# Patient Record
Sex: Female | Born: 1995 | Race: Black or African American | Hispanic: No | Marital: Single | Smoking: Never smoker
Health system: Southern US, Community
[De-identification: ages and names within clinical notes are randomized; demographics above are authoritative.]

## PROBLEM LIST (undated history)

## (undated) DIAGNOSIS — D649 Anemia, unspecified: Secondary | ICD-10-CM

## (undated) DIAGNOSIS — K2970 Gastritis, unspecified, without bleeding: Secondary | ICD-10-CM

## (undated) HISTORY — DX: Gastritis, unspecified, without bleeding: K29.70

## (undated) HISTORY — PX: DILATION AND CURETTAGE OF UTERUS: SHX78

---

## 2019-03-23 ENCOUNTER — Emergency Department (HOSPITAL_COMMUNITY)
Admission: EM | Admit: 2019-03-23 | Discharge: 2019-03-23 | Discharge status: Against Medical Advice | Payer: Self-pay | Attending: Emergency Medicine | Admitting: Emergency Medicine

## 2019-03-23 DIAGNOSIS — Z5321 Procedure and treatment not carried out due to patient leaving prior to being seen by health care provider: Secondary | ICD-10-CM | POA: Insufficient documentation

## 2019-03-23 NOTE — ED Triage Notes (Signed)
Pt has left upper dental pain for "quite sometime" worse in the past few days.

## 2019-04-02 ENCOUNTER — Emergency Department (HOSPITAL_COMMUNITY)
Admission: EM | Admit: 2019-04-02 | Discharge: 2019-04-02 | Disposition: A | Discharge status: Home / Self Care | Payer: Self-pay | Attending: Emergency Medicine | Admitting: Emergency Medicine

## 2019-04-02 ENCOUNTER — Encounter (HOSPITAL_COMMUNITY): Payer: Self-pay | Admitting: Emergency Medicine

## 2019-04-02 ENCOUNTER — Emergency Department (HOSPITAL_COMMUNITY): Payer: Self-pay

## 2019-04-02 ENCOUNTER — Other Ambulatory Visit: Payer: Self-pay

## 2019-04-02 DIAGNOSIS — R1013 Epigastric pain: Secondary | ICD-10-CM

## 2019-04-02 DIAGNOSIS — K2970 Gastritis, unspecified, without bleeding: Secondary | ICD-10-CM | POA: Insufficient documentation

## 2019-04-02 HISTORY — DX: Anemia, unspecified: D64.9

## 2019-04-02 LAB — HCG, QUANTITATIVE, PREGNANCY: hCG, Beta Chain, Quant, S: 891 m[IU]/mL — ABNORMAL HIGH (ref <5)

## 2019-04-02 LAB — CBC WITH DIFFERENTIAL/PLATELET
Abs Immature Granulocytes: 0.02 10*3/uL (ref 0.00–0.07)
Basophils Absolute: 0 10*3/uL (ref 0.0–0.1)
Basophils Relative: 1 %
Eosinophils Absolute: 0.2 10*3/uL (ref 0.0–0.5)
Eosinophils Relative: 3 %
HCT: 32.8 % — ABNORMAL LOW (ref 36.0–46.0)
Hemoglobin: 10.8 g/dL — ABNORMAL LOW (ref 12.0–15.0)
Immature Granulocytes: 0 %
Lymphocytes Relative: 26 %
Lymphs Abs: 2.1 10*3/uL (ref 0.7–4.0)
MCH: 29.4 pg (ref 26.0–34.0)
MCHC: 32.9 g/dL (ref 30.0–36.0)
MCV: 89.4 fL (ref 80.0–100.0)
Monocytes Absolute: 1 10*3/uL (ref 0.1–1.0)
Monocytes Relative: 12 %
Neutro Abs: 4.7 10*3/uL (ref 1.7–7.7)
Neutrophils Relative %: 58 %
Platelets: 274 10*3/uL (ref 150–400)
RBC: 3.67 MIL/uL — ABNORMAL LOW (ref 3.87–5.11)
RDW: 13.8 % (ref 11.5–15.5)
WBC: 8 10*3/uL (ref 4.0–10.5)
nRBC: 0 % (ref 0.0–0.2)

## 2019-04-02 LAB — POC URINE PREG, ED: Preg Test, Ur: POSITIVE — AB

## 2019-04-02 LAB — COMPREHENSIVE METABOLIC PANEL
ALT: 20 U/L (ref 0–44)
AST: 20 U/L (ref 15–41)
Albumin: 3.3 g/dL — ABNORMAL LOW (ref 3.5–5.0)
Alkaline Phosphatase: 75 U/L (ref 38–126)
Anion gap: 8 (ref 5–15)
BUN: 9 mg/dL (ref 6–20)
CO2: 22 mmol/L (ref 22–32)
Calcium: 8.7 mg/dL — ABNORMAL LOW (ref 8.9–10.3)
Chloride: 106 mmol/L (ref 98–111)
Creatinine, Ser: 0.73 mg/dL (ref 0.44–1.00)
GFR calc Af Amer: >60 mL/min (ref >60)
GFR calc non Af Amer: >60 mL/min (ref >60)
Glucose, Bld: 100 mg/dL — ABNORMAL HIGH (ref 70–99)
Potassium: 3.5 mmol/L (ref 3.5–5.1)
Sodium: 136 mmol/L (ref 135–145)
Total Bilirubin: 0.5 mg/dL (ref 0.3–1.2)
Total Protein: 7.4 g/dL (ref 6.5–8.1)

## 2019-04-02 LAB — URINALYSIS, ROUTINE W REFLEX MICROSCOPIC
Bilirubin Urine: NEGATIVE
Glucose, UA: NEGATIVE mg/dL
Hgb urine dipstick: NEGATIVE
Ketones, ur: NEGATIVE mg/dL
Leukocytes,Ua: NEGATIVE
Nitrite: NEGATIVE
Protein, ur: NEGATIVE mg/dL
Specific Gravity, Urine: 1.027 (ref 1.005–1.030)
pH: 5 (ref 5.0–8.0)

## 2019-04-02 LAB — LIPASE, BLOOD: Lipase: 20 U/L (ref 11–51)

## 2019-04-02 NOTE — Discharge Instructions (Signed)
Please start taking famotidine, 20 mg daily to help control any acid reflux or gastritis.  This is safe in pregnancy.  Your blood work and ultrasound were normal, and does show that you are pregnant, very early, likely within the first 4 weeks of pregnancy.  2 weeks - follow up with a gynecologist - see phone number above for local physicians

## 2019-04-02 NOTE — ED Provider Notes (Signed)
[facility]   CSN: [id number] Arrival date & time: 04/02/19  1812     History   Chief Complaint Chief Complaint  Patient presents with  . Abdominal Pain    HPI Julia Salinas is a 23 y.o. female.     HPI  This patient is a very pleasant 23 year old female, G8, P2 at approximately 5 weeks based on last menstrual cycle.  She presents to the hospital today with a complaint of epigastric pain.  She reports this epigastric pain radiates into the right upper quadrant and the right lateral abdomen.  It does not go to the back, it is not associated with urinary symptoms nor is it associated with any vaginal discharge or bleeding.  She reports that she took a pregnancy test today and it was positive.  Her abdominal pain has been intermittent until the last 2 days where it is become more constant.  There is no nausea or vomiting, no changes in stools, she does not think it gets worse with position or with eating.  She has never had cholecystitis pancreatitis and does not drink any alcohol or smoke cigarettes.  At this time the pain is moderate  Past Medical History:  Diagnosis Date  . Chronic anemia     There are no active problems to display for this patient.   Past Surgical History:  Procedure Laterality Date  . DILATION AND CURETTAGE OF UTERUS       OB History    Gravida  1   Para      Term      Preterm      AB      Living        SAB      TAB      Ectopic      Multiple      Live Births               Home Medications    Prior to Admission medications   Not on File    Family History No family history on file.  Social History Social History   Tobacco Use  . Smoking status: Never Smoker  . Smokeless tobacco: Never Used  Substance Use Topics  . Alcohol use: Never    Frequency: Never  . Drug use: Never     Allergies   Penicillins   Review of Systems Review of Systems  All other  systems reviewed and are negative.    Physical Exam Updated Vital Signs BP 134/78 (BP Location: Left Arm)   Pulse 92   Temp 98.7 F (37.1 C) (Oral)   Resp 16   Ht 1.702 m (5\' 7" )   Wt 99.8 kg   LMP 03/02/2019 (Exact Date)   SpO2 100%   BMI 34.46 kg/m   Physical Exam Vitals signs and nursing note reviewed.  Constitutional:      General: She is not in acute distress.    Appearance: She is well-developed.  HENT:     Head: Normocephalic and atraumatic.     Mouth/Throat:     Pharynx: No oropharyngeal exudate.  Eyes:     General: No scleral icterus.       Right eye: No discharge.        Left eye: No discharge.     Conjunctiva/sclera: Conjunctivae normal.     Pupils: Pupils are equal, round, and reactive to light.  Neck:     Musculoskeletal: Normal range of motion and  neck supple.     Thyroid: No thyromegaly.     Vascular: No JVD.  Cardiovascular:     Rate and Rhythm: Normal rate and regular rhythm.     Heart sounds: Normal heart sounds. No murmur. No friction rub. No gallop.   Pulmonary:     Effort: Pulmonary effort is normal. No respiratory distress.     Breath sounds: Normal breath sounds. No wheezing or rales.  Abdominal:     General: Bowel sounds are normal. There is no distension.     Palpations: Abdomen is soft. There is no mass.     Tenderness: There is abdominal tenderness in the right upper quadrant and epigastric area.  Musculoskeletal: Normal range of motion.        General: No tenderness.  Lymphadenopathy:     Cervical: No cervical adenopathy.  Skin:    General: Skin is warm and dry.     Findings: No erythema or rash.  Neurological:     Mental Status: She is alert.     Coordination: Coordination normal.  Psychiatric:        Behavior: Behavior normal.      ED Treatments / Results  Labs (all labs ordered are listed, but only abnormal results are displayed) Labs Reviewed  CBC WITH DIFFERENTIAL/PLATELET - Abnormal; Notable for the following  components:      Result Value   RBC 3.67 (*)    Hemoglobin 10.8 (*)    HCT 32.8 (*)    All other components within normal limits  COMPREHENSIVE METABOLIC PANEL - Abnormal; Notable for the following components:   Glucose, Bld 100 (*)    Calcium 8.7 (*)    Albumin 3.3 (*)    All other components within normal limits  HCG, QUANTITATIVE, PREGNANCY - Abnormal; Notable for the following components:   hCG, Beta Chain, Quant, S 891 (*)    All other components within normal limits  POC URINE PREG, ED - Abnormal; Notable for the following components:   Preg Test, Ur POSITIVE (*)    All other components within normal limits  URINE CULTURE  URINALYSIS, ROUTINE W REFLEX MICROSCOPIC  LIPASE, BLOOD    EKG None  Radiology US Abdomen Limited  Result Date: 04/02/2019 CLINICAL DATA:  Right upper quadrant pain for 3 days EXAM: ULTRASOUND ABDOMEN LIMITED RIGHT UPPER QUADRANT COMPARISON:  None. FINDINGS: Gallbladder: No gallstones or wall thickening visualized. No sonographic Murphy sign noted by sonographer. Common bile duct: Diameter: 3.8 mm Liver: Increased echogenicity in the liver with no focal mass. Portal vein is patent on color Doppler imaging with normal direction of blood flow towards the liver. Other: None. IMPRESSION: 1. No acute abnormality identified 2. Increased echogenicity diffusely throughout the liver is nonspecific but often seen with hepatic steatosis. Electronically Signed   By: Gerome Sam III M.D   On: 04/02/2019 20:43    Procedures Procedures (including critical care time)  Medications Ordered in ED Medications - No data to display   Initial Impression / Assessment and Plan / ED Course  I have reviewed the triage vital signs and the nursing notes.  Pertinent labs & imaging results that were available during my care of the patient were reviewed by me and considered in my medical decision making (see chart for details).  Clinical Course as of Apr 01 2056  Sat Apr 02, 2019  2051 Thankfully the lab work, urine samples and pregnancy test do confirm she is early pregnant, no signs of cholecystitis, the patient  is totally stable for discharge, recommendations made regarding potential gastritis   [BM]    Clinical Course User Index [BM] Noemi Chapel, MD       The patient has absolutely no lower abdominal tenderness, she does have mild epigastric and right upper quadrant tenderness but no Murphy sign.  Labs pending, she does not have a leukocytosis, metabolic panel is pending, lipase pending, check quant to verify pregnancy, ultrasound to evaluate for gallbladder.  Patient agreeable  Final Clinical Impressions(s) / ED Diagnoses   Final diagnoses:  Epigastric pain  Gastritis without bleeding, unspecified chronicity, unspecified gastritis type      Noemi Chapel, MD 04/02/19 2057

## 2019-04-02 NOTE — ED Notes (Signed)
Called lab to add on urine culture ?

## 2019-04-02 NOTE — ED Triage Notes (Signed)
Pt to ED with c/o mid upper abd pain and upper back pain onset 3 days ago.  Pt denies any nausea, vomiting or diarrhea.

## 2019-04-04 LAB — URINE CULTURE

## 2019-04-19 ENCOUNTER — Emergency Department (HOSPITAL_COMMUNITY)
Admission: EM | Admit: 2019-04-19 | Discharge: 2019-04-20 | Disposition: A | Discharge status: Home / Self Care | Payer: Self-pay | Attending: Emergency Medicine | Admitting: Emergency Medicine

## 2019-04-19 ENCOUNTER — Encounter (HOSPITAL_COMMUNITY): Payer: Self-pay

## 2019-04-19 ENCOUNTER — Other Ambulatory Visit: Payer: Self-pay

## 2019-04-19 DIAGNOSIS — R109 Unspecified abdominal pain: Secondary | ICD-10-CM | POA: Insufficient documentation

## 2019-04-19 DIAGNOSIS — R42 Dizziness and giddiness: Secondary | ICD-10-CM | POA: Insufficient documentation

## 2019-04-19 DIAGNOSIS — R519 Headache, unspecified: Secondary | ICD-10-CM | POA: Insufficient documentation

## 2019-04-19 DIAGNOSIS — O26891 Other specified pregnancy related conditions, first trimester: Secondary | ICD-10-CM | POA: Insufficient documentation

## 2019-04-19 DIAGNOSIS — Z5321 Procedure and treatment not carried out due to patient leaving prior to being seen by health care provider: Secondary | ICD-10-CM | POA: Insufficient documentation

## 2019-04-19 NOTE — ED Triage Notes (Signed)
Pt states that she's about [redacted] weeks pregnant, she says that she's been cramping for a few days, having headaches and feeling like she was going to pass out Pt states she thinks she had preeclampsia with other pregnancy

## 2019-04-19 NOTE — ED Notes (Signed)
CURRENTLY USING PHONE 

## 2019-04-22 ENCOUNTER — Emergency Department (HOSPITAL_COMMUNITY)
Admission: EM | Admit: 2019-04-22 | Discharge: 2019-04-22 | Disposition: A | Discharge status: Home / Self Care | Payer: Self-pay | Attending: Emergency Medicine | Admitting: Emergency Medicine

## 2019-04-22 ENCOUNTER — Encounter (HOSPITAL_COMMUNITY): Payer: Self-pay

## 2019-04-22 ENCOUNTER — Emergency Department (HOSPITAL_COMMUNITY): Payer: Self-pay

## 2019-04-22 ENCOUNTER — Other Ambulatory Visit: Payer: Self-pay

## 2019-04-22 DIAGNOSIS — O209 Hemorrhage in early pregnancy, unspecified: Secondary | ICD-10-CM | POA: Insufficient documentation

## 2019-04-22 DIAGNOSIS — O4690 Antepartum hemorrhage, unspecified, unspecified trimester: Secondary | ICD-10-CM

## 2019-04-22 DIAGNOSIS — B9689 Other specified bacterial agents as the cause of diseases classified elsewhere: Secondary | ICD-10-CM | POA: Insufficient documentation

## 2019-04-22 DIAGNOSIS — O23591 Infection of other part of genital tract in pregnancy, first trimester: Secondary | ICD-10-CM | POA: Insufficient documentation

## 2019-04-22 DIAGNOSIS — O99891 Other specified diseases and conditions complicating pregnancy: Secondary | ICD-10-CM | POA: Insufficient documentation

## 2019-04-22 DIAGNOSIS — Z3A01 Less than 8 weeks gestation of pregnancy: Secondary | ICD-10-CM | POA: Insufficient documentation

## 2019-04-22 LAB — COMPREHENSIVE METABOLIC PANEL
ALT: 13 U/L (ref 0–44)
AST: 14 U/L — ABNORMAL LOW (ref 15–41)
Albumin: 4 g/dL (ref 3.5–5.0)
Alkaline Phosphatase: 67 U/L (ref 38–126)
Anion gap: 9 (ref 5–15)
BUN: 10 mg/dL (ref 6–20)
CO2: 22 mmol/L (ref 22–32)
Calcium: 8.9 mg/dL (ref 8.9–10.3)
Chloride: 104 mmol/L (ref 98–111)
Creatinine, Ser: 0.58 mg/dL (ref 0.44–1.00)
GFR calc Af Amer: >60 mL/min (ref >60)
GFR calc non Af Amer: >60 mL/min (ref >60)
Glucose, Bld: 88 mg/dL (ref 70–99)
Potassium: 3.2 mmol/L — ABNORMAL LOW (ref 3.5–5.1)
Sodium: 135 mmol/L (ref 135–145)
Total Bilirubin: 0.7 mg/dL (ref 0.3–1.2)
Total Protein: 8.3 g/dL — ABNORMAL HIGH (ref 6.5–8.1)

## 2019-04-22 LAB — CBC WITH DIFFERENTIAL/PLATELET
Abs Immature Granulocytes: 0.02 10*3/uL (ref 0.00–0.07)
Basophils Absolute: 0 10*3/uL (ref 0.0–0.1)
Basophils Relative: 1 %
Eosinophils Absolute: 0.2 10*3/uL (ref 0.0–0.5)
Eosinophils Relative: 2 %
HCT: 33.9 % — ABNORMAL LOW (ref 36.0–46.0)
Hemoglobin: 10.8 g/dL — ABNORMAL LOW (ref 12.0–15.0)
Immature Granulocytes: 0 %
Lymphocytes Relative: 34 %
Lymphs Abs: 2.3 10*3/uL (ref 0.7–4.0)
MCH: 28.6 pg (ref 26.0–34.0)
MCHC: 31.9 g/dL (ref 30.0–36.0)
MCV: 89.7 fL (ref 80.0–100.0)
Monocytes Absolute: 1.1 10*3/uL — ABNORMAL HIGH (ref 0.1–1.0)
Monocytes Relative: 17 %
Neutro Abs: 3.1 10*3/uL (ref 1.7–7.7)
Neutrophils Relative %: 46 %
Platelets: 283 10*3/uL (ref 150–400)
RBC: 3.78 MIL/uL — ABNORMAL LOW (ref 3.87–5.11)
RDW: 13.5 % (ref 11.5–15.5)
WBC: 6.7 10*3/uL (ref 4.0–10.5)
nRBC: 0 % (ref 0.0–0.2)

## 2019-04-22 LAB — URINALYSIS, ROUTINE W REFLEX MICROSCOPIC
Bilirubin Urine: NEGATIVE
Glucose, UA: NEGATIVE mg/dL
Hgb urine dipstick: NEGATIVE
Ketones, ur: NEGATIVE mg/dL
Leukocytes,Ua: NEGATIVE
Nitrite: NEGATIVE
Protein, ur: 30 mg/dL — AB
Specific Gravity, Urine: 1.029 (ref 1.005–1.030)
pH: 5 (ref 5.0–8.0)

## 2019-04-22 LAB — WET PREP, GENITAL
Sperm: NONE SEEN
Trich, Wet Prep: NONE SEEN
Yeast Wet Prep HPF POC: NONE SEEN

## 2019-04-22 LAB — HCG, QUANTITATIVE, PREGNANCY: hCG, Beta Chain, Quant, S: 98695 m[IU]/mL — ABNORMAL HIGH (ref <5)

## 2019-04-22 LAB — ABO/RH: ABO/RH(D): O POS

## 2019-04-22 LAB — I-STAT BETA HCG BLOOD, ED (MC, WL, AP ONLY): I-stat hCG, quantitative: >2000 m[IU]/mL — ABNORMAL HIGH (ref <5)

## 2019-04-22 LAB — HIV ANTIBODY (ROUTINE TESTING W REFLEX): HIV Screen 4th Generation wRfx: NONREACTIVE

## 2019-04-22 MED ORDER — METRONIDAZOLE 0.75 % VA GEL: 1.0000 | Freq: Every day | VAGINAL | 0 refills | Status: AC

## 2019-04-22 MED ORDER — SODIUM CHLORIDE 0.9 % IV BOLUS
1000.0000 mL | Freq: Once | INTRAVENOUS | Status: AC
  Administered 2019-04-22: 16:00:00 1000 mL via INTRAVENOUS

## 2019-04-22 MED ORDER — POTASSIUM CHLORIDE CRYS ER 20 MEQ PO TBCR: 20.0000 meq | EXTENDED_RELEASE_TABLET | Freq: Every day | ORAL | 0 refills | Status: DC

## 2019-04-22 NOTE — ED Provider Notes (Signed)
[facility] DEPT Provider Note   CSN: [id number] Arrival date & time: 04/22/19  1530     History   Chief Complaint Chief Complaint  Patient presents with  . Vaginal Bleeding    [redacted] weeks pregnant    HPI Julia Salinas is a 23 y.o. female with a hx of chronic anemia & prior D&C who is currently about [redacted] weeks pregnant G7P2A4 & presents to the ED with complaints of pelvic pain and vaginal bleeding for the past few days.  Patient states she had constant pelvic cramping bilaterally that is currently a 7 out of 10 in severity without alleviating or aggravating factors associated with vaginal bleeding described more as pink spotting when wiping with toilet paper. This concerned her therefore she came to the ED a few days ago and left due to waits, returned today due to continued sxs. Has felt nauseated throughout early pregnancy. Denies fever, chills, emesis, diarrhea, melena, hematochezia, vaginal discharge, or dysuria. Patient has not had [place] with this pregnancy.      HPI  Past Medical History:  Diagnosis Date  . Chronic anemia     There are no active problems to display for this patient.   Past Surgical History:  Procedure Laterality Date  . DILATION AND CURETTAGE OF UTERUS       OB History    Gravida  1   Para      Term      Preterm      AB      Living        SAB      TAB      Ectopic      Multiple      Live Births               Home Medications    Prior to Admission medications   Not on File    Family History Family History  Problem Relation Age of Onset  . Heart failure Mother   . Lupus Father     Social History Social History   Tobacco Use  . Smoking status: Never Smoker  . Smokeless tobacco: Never Used  Substance Use Topics  . Alcohol use: Never    Frequency: Never  . Drug use: Never     Allergies   Penicillins   Review of Systems Review of Systems  Constitutional: Negative for chills and  fever.  Respiratory: Negative for shortness of breath.   Cardiovascular: Negative for chest pain.  Gastrointestinal: Positive for nausea. Negative for blood in stool, constipation, diarrhea and vomiting.  Genitourinary: Positive for pelvic pain and vaginal bleeding. Negative for dysuria, urgency and vaginal discharge.  Neurological: Negative for syncope.  All other systems reviewed and are negative.    Physical Exam Updated Vital Signs BP 131/60 (BP Location: Left Arm)   Pulse 85   Temp 98.6 F (37 C) (Oral)   Resp 16   Ht 5\' 7"  (1.702 m)   Wt 95.3 kg   LMP 03/02/2019 (Exact Date)   SpO2 99%   BMI 32.89 kg/m   Physical Exam Vitals signs and nursing note reviewed. Exam conducted with a chaperone present.  Constitutional:      General: She is not in acute distress.    Appearance: She is well-developed. She is not toxic-appearing.  HENT:     Head: Normocephalic and atraumatic.  Eyes:     General:        Right eye: No discharge.  Left eye: No discharge.     Conjunctiva/sclera: Conjunctivae normal.  Neck:     Musculoskeletal: Neck supple.  Cardiovascular:     Rate and Rhythm: Normal rate and regular rhythm.  Pulmonary:     Effort: Pulmonary effort is normal. No respiratory distress.     Breath sounds: Normal breath sounds. No wheezing, rhonchi or rales.  Abdominal:     General: There is no distension.     Palpations: Abdomen is soft.     Tenderness: There is abdominal tenderness (mild suprapubic). There is no right CVA tenderness, left CVA tenderness, guarding or rebound.  Genitourinary:    Labia:        Right: No lesion.        Left: No lesion.      Vagina: No bleeding.     Cervix: Discharge (mild white) present. No cervical motion tenderness, friability or cervical bleeding.     Adnexa:        Right: Tenderness present.        Left: No tenderness.    Skin:    General: Skin is warm and dry.     Findings: No rash.  Neurological:     Mental Status: She is  alert.     Comments: Clear speech.   Psychiatric:        Behavior: Behavior normal.    ED Treatments / Results  Labs (all labs ordered are listed, but only abnormal results are displayed) Labs Reviewed  WET PREP, GENITAL - Abnormal; Notable for the following components:      Result Value   Clue Cells Wet Prep HPF POC PRESENT (*)    WBC, Wet Prep HPF POC MODERATE (*)    All other components within normal limits  URINALYSIS, ROUTINE W REFLEX MICROSCOPIC - Abnormal; Notable for the following components:   Color, Urine AMBER (*)    APPearance HAZY (*)    Protein, ur 30 (*)    Bacteria, UA RARE (*)    All other components within normal limits  HCG, QUANTITATIVE, PREGNANCY - Abnormal; Notable for the following components:   hCG, Beta Chain, Quant, S [id number] (*)    All other components within normal limits  CBC WITH DIFFERENTIAL/PLATELET - Abnormal; Notable for the following components:   RBC 3.78 (*)    Hemoglobin 10.8 (*)    HCT 33.9 (*)    Monocytes Absolute 1.1 (*)    All other components within normal limits  COMPREHENSIVE METABOLIC PANEL - Abnormal; Notable for the following components:   Potassium 3.2 (*)    Total Protein 8.3 (*)    AST 14 (*)    All other components within normal limits  I-STAT BETA HCG BLOOD, ED ([facility], [facility], [facility] ONLY) - Abnormal; Notable for the following components:   I-stat hCG, quantitative >2,000.0 (*)    All other components within normal limits  URINE CULTURE  RPR  HIV ANTIBODY (ROUTINE TESTING W REFLEX)  HIV4GL SAVE TUBE  I-STAT BETA HCG BLOOD, ED ([facility], [facility], [facility] ONLY)  ABO/RH  GC/CHLAMYDIA PROBE AMP ([place]) NOT AT [facility]    EKG None  Radiology No results found.  Procedures Procedures (including critical care time)  Medications Ordered in ED Medications - No data to display   Initial Impression / Assessment and Plan / ED Course  I have reviewed the triage vital signs and the nursing notes.  Pertinent labs & imaging results that were  available during my care of the patient were reviewed by  me and considered in my medical decision making (see chart for details).   27P2A4 female estimated to be [redacted] weeks pregnant presents to the ED w/ complaints of pelvic cramping & vaginal spotting. Nontoxic appearing, no apparent distress, vitals WNL. Exam w/ some mild suprapubic tenderness, R adnexal tenderness on bimanual, some discharge w/ speculum exam no bleeding. Plan for labs & US.   CBC: Anemia consistent w prior. No leukocytosis.  CMP: Mild hypokalemia- will orally replace. Otherwise fairly unremarkable.  UA: No UTI, rare bacteria- given pregnancy will add on culture but do not feel this needs treatment. Protein present- PCP recheck.  Wet prep: BV- will tx w/ metrogel Preg test: positive Quant: 98,695  18:00: Patient care signed out to Glenford BayleyAlex Law PA-C at change of shift pending ultrasound and disposition.     Final Clinical Impressions(s) / ED Diagnoses   Final diagnoses:  Vaginal bleeding in pregnancy    ED Discharge Orders         Ordered    metroNIDAZOLE (METROGEL VAGINAL) 0.75 % vaginal gel  Daily at bedtime     04/22/19 1759    potassium chloride SA (KLOR-CON) 20 MEQ tablet  Daily     04/22/19 1759           Cherly Andersonetrucelli, Emmalea Treanor R, PA-C 04/22/19 1801    Lorre NickAllen, Anthony, MD 04/25/19 1220

## 2019-04-22 NOTE — ED Provider Notes (Signed)
Signout from previous provider Colletta Maryland, PA-C at shift change See previous provider note for full H&P  Briefly, patient presenting with vaginal spotting in the setting of 7 weeks pregnancy.  Patient has not seen OB/GYN yet.  Patient found to have BV and mild hypokalemia, previous provider plan to treat with MetroGel and potassium.  Ultrasound is pending.  Ultrasound returns with single intrauterine gestation at 7 weeks, 1 day with no subchorionic hemorrhage.  Plan to follow-up with OB as planned.  Patient understands and agrees with plan.  Patient vital stable discharge in satisfactory condition.    Frederica Kuster, PA-C 04/22/19 1937    Lacretia Leigh, MD 04/25/19 1220

## 2019-04-22 NOTE — Discharge Instructions (Addendum)
You were seen in the emergency department today for pelvic cramping and vaginal bleeding in pregnancy.  Your work-up was overall reassuring.  Your wet prep showed bacterial vaginosis.  She we are treating with metronidazole gel to use nightly intravaginally for the next 5 days.  Your potassium was a bit low therefore we are sending you home with a potassium supplement.  Your ultrasound showed a single intrauterine gestation at 7 weeks 1 day.  There were no complications seen.  We would like you to follow-up with OB/GYN within the next 3 to 5 days.  Return to the ER for new or worsening symptoms including but not limited to worsening pain, increased bleeding, passing out, dizziness, inability to keep fluids down, or any other concerns.  You may also return to our MAU which is a an emergency department for pregnant women.

## 2019-04-22 NOTE — ED Triage Notes (Signed)
Patient states she began having abdominal cramping and vaginal bleeding light pink in color 2 days ago. Patient state she came to the ED, but left due to wait times. Patient states today the abdominal cramping is worse,mbut the vaginal bleeding has not changed.

## 2019-04-23 LAB — RPR: RPR Ser Ql: NONREACTIVE

## 2019-04-24 LAB — URINE CULTURE: Culture: <10000 — AB

## 2019-04-26 LAB — GC/CHLAMYDIA PROBE AMP (~~LOC~~) NOT AT ARMC
Chlamydia: NEGATIVE
Neisseria Gonorrhea: NEGATIVE

## 2019-06-10 ENCOUNTER — Emergency Department (HOSPITAL_COMMUNITY)
Admission: EM | Admit: 2019-06-10 | Discharge: 2019-06-10 | Disposition: A | Discharge status: Home / Self Care | Payer: Self-pay | Attending: Emergency Medicine | Admitting: Emergency Medicine

## 2019-06-10 ENCOUNTER — Encounter (HOSPITAL_COMMUNITY): Payer: Self-pay | Admitting: Physician Assistant

## 2019-06-10 ENCOUNTER — Emergency Department (HOSPITAL_COMMUNITY): Payer: Self-pay

## 2019-06-10 ENCOUNTER — Other Ambulatory Visit: Payer: Self-pay

## 2019-06-10 DIAGNOSIS — R1031 Right lower quadrant pain: Secondary | ICD-10-CM | POA: Insufficient documentation

## 2019-06-10 DIAGNOSIS — B9689 Other specified bacterial agents as the cause of diseases classified elsewhere: Secondary | ICD-10-CM

## 2019-06-10 DIAGNOSIS — R1032 Left lower quadrant pain: Secondary | ICD-10-CM | POA: Insufficient documentation

## 2019-06-10 DIAGNOSIS — Z3A14 14 weeks gestation of pregnancy: Secondary | ICD-10-CM | POA: Insufficient documentation

## 2019-06-10 DIAGNOSIS — O23592 Infection of other part of genital tract in pregnancy, second trimester: Secondary | ICD-10-CM | POA: Insufficient documentation

## 2019-06-10 DIAGNOSIS — O26892 Other specified pregnancy related conditions, second trimester: Secondary | ICD-10-CM | POA: Insufficient documentation

## 2019-06-10 DIAGNOSIS — Z79899 Other long term (current) drug therapy: Secondary | ICD-10-CM | POA: Insufficient documentation

## 2019-06-10 LAB — CBC WITH DIFFERENTIAL/PLATELET
Abs Immature Granulocytes: 0.03 10*3/uL (ref 0.00–0.07)
Basophils Absolute: 0 10*3/uL (ref 0.0–0.1)
Basophils Relative: 0 %
Eosinophils Absolute: 0.3 10*3/uL (ref 0.0–0.5)
Eosinophils Relative: 3 %
HCT: 32.6 % — ABNORMAL LOW (ref 36.0–46.0)
Hemoglobin: 10.5 g/dL — ABNORMAL LOW (ref 12.0–15.0)
Immature Granulocytes: 0 %
Lymphocytes Relative: 19 %
Lymphs Abs: 1.6 10*3/uL (ref 0.7–4.0)
MCH: 29.8 pg (ref 26.0–34.0)
MCHC: 32.2 g/dL (ref 30.0–36.0)
MCV: 92.6 fL (ref 80.0–100.0)
Monocytes Absolute: 0.9 10*3/uL (ref 0.1–1.0)
Monocytes Relative: 11 %
Neutro Abs: 5.8 10*3/uL (ref 1.7–7.7)
Neutrophils Relative %: 67 %
Platelets: 262 10*3/uL (ref 150–400)
RBC: 3.52 MIL/uL — ABNORMAL LOW (ref 3.87–5.11)
RDW: 14.6 % (ref 11.5–15.5)
WBC: 8.7 10*3/uL (ref 4.0–10.5)
nRBC: 0 % (ref 0.0–0.2)

## 2019-06-10 LAB — HCG, QUANTITATIVE, PREGNANCY: hCG, Beta Chain, Quant, S: 38253 m[IU]/mL — ABNORMAL HIGH (ref <5)

## 2019-06-10 LAB — COMPREHENSIVE METABOLIC PANEL
ALT: <5 U/L (ref 0–44)
AST: 12 U/L — ABNORMAL LOW (ref 15–41)
Albumin: 3 g/dL — ABNORMAL LOW (ref 3.5–5.0)
Alkaline Phosphatase: 60 U/L (ref 38–126)
Anion gap: 8 (ref 5–15)
BUN: 8 mg/dL (ref 6–20)
CO2: 22 mmol/L (ref 22–32)
Calcium: 8.8 mg/dL — ABNORMAL LOW (ref 8.9–10.3)
Chloride: 106 mmol/L (ref 98–111)
Creatinine, Ser: 0.53 mg/dL (ref 0.44–1.00)
GFR calc Af Amer: >60 mL/min (ref >60)
GFR calc non Af Amer: >60 mL/min (ref >60)
Glucose, Bld: 98 mg/dL (ref 70–99)
Potassium: 3.5 mmol/L (ref 3.5–5.1)
Sodium: 136 mmol/L (ref 135–145)
Total Bilirubin: 0.3 mg/dL (ref 0.3–1.2)
Total Protein: 7.4 g/dL (ref 6.5–8.1)

## 2019-06-10 LAB — WET PREP, GENITAL
Sperm: NONE SEEN
Trich, Wet Prep: NONE SEEN
Yeast Wet Prep HPF POC: NONE SEEN

## 2019-06-10 LAB — URINALYSIS, ROUTINE W REFLEX MICROSCOPIC
Bilirubin Urine: NEGATIVE
Glucose, UA: NEGATIVE mg/dL
Hgb urine dipstick: NEGATIVE
Ketones, ur: NEGATIVE mg/dL
Leukocytes,Ua: NEGATIVE
Nitrite: NEGATIVE
Protein, ur: NEGATIVE mg/dL
Specific Gravity, Urine: 1.02 (ref 1.005–1.030)
pH: 7 (ref 5.0–8.0)

## 2019-06-10 MED ORDER — METRONIDAZOLE 500 MG PO TABS: 500.0000 mg | ORAL_TABLET | Freq: Two times a day (BID) | ORAL | 0 refills | Status: DC

## 2019-06-10 MED ORDER — METRONIDAZOLE 500 MG PO TABS
500.0000 mg | ORAL_TABLET | Freq: Once | ORAL | Status: AC
  Administered 2019-06-10: 500 mg via ORAL
  Filled 2019-06-10: qty 1

## 2019-06-10 MED ORDER — SODIUM CHLORIDE 0.9 % IV BOLUS
1000.0000 mL | Freq: Once | INTRAVENOUS | Status: AC
  Administered 2019-06-10: 1000 mL via INTRAVENOUS

## 2019-06-10 NOTE — ED Notes (Signed)
Provider at bedside

## 2019-06-10 NOTE — ED Provider Notes (Signed)
[facility]-EMERGENCY DEPT Provider Note   CSN: [id number] Arrival date & time: 06/10/19  1452     History   Chief Complaint Chief Complaint  Patient presents with  . Groin Swelling    vaginal pressure (14 wks preg)    HPI Julia Salinas is a 23 y.o. female G7P2 currently [redacted]w[redacted]d who presents today for evaluation of pelvic and groin pain.  She reports that over the past 4 days she has had worsening pain.  She states that it is worse when she stands and walks.  She reports that she feels like it is a significant pressure, primarily on the left side.  The states that she has seen a OB/GYN this pregnancy in [place] as that is where her insurance is however has not seen them locally.  She currently denies any vaginal bleeding, spotting, or loss of fluids.  She states that she feels otherwise well.  She denies dysuria.  She states that she has had 2 spontaneous abortions, 1 of which was at about 15 weeks and states this feels similar.    She was previously seen at about 7 weeks for vaginal bleeding at that time ultrasound was obtained showing a intrauterine gestation.       HPI  Past Medical History:  Diagnosis Date  . Chronic anemia     There are no active problems to display for this patient.   Past Surgical History:  Procedure Laterality Date  . DILATION AND CURETTAGE OF UTERUS       OB History    Gravida  7   Para  2   Term      Preterm      AB  4   Living        SAB  2   TAB  2   Ectopic      Multiple      Live Births  2            Home Medications    Prior to Admission medications   Medication Sig Start Date End Date Taking? Authorizing Provider  Prenatal Vit-Fe Fumarate-FA (MULTIVITAMIN-PRENATAL) 27-0.8 MG TABS tablet Take 1 tablet by mouth daily.   Yes [provider]  metroNIDAZOLE (FLAGYL) 500 MG tablet Take 1 tablet (500 mg total) by mouth 2 (two) times daily. 06/10/19   Cristina Gong, PA-C  potassium  chloride SA (KLOR-CON) 20 MEQ tablet Take 1 tablet (20 mEq total) by mouth daily. Patient not taking: Reported on 06/10/2019 04/22/19   Petrucelli, Pleas Koch, PA-C    Family History Family History  Problem Relation Age of Onset  . Heart failure Mother   . Lupus Father     Social History Social History   Tobacco Use  . Smoking status: Never Smoker  . Smokeless tobacco: Never Used  Substance Use Topics  . Alcohol use: Never    Frequency: Never  . Drug use: Never     Allergies   Penicillins   Review of Systems Review of Systems  Constitutional: Negative for chills and fever.  Respiratory: Negative for cough and shortness of breath.   Genitourinary: Negative for dysuria, frequency, urgency, vaginal bleeding, vaginal discharge and vaginal pain.       Pelvic and bilateral inguinal (L>R) pain  Musculoskeletal: Negative for back pain.  Neurological: Negative for weakness.  Psychiatric/Behavioral: Negative for confusion.  All other systems reviewed and are negative.    Physical Exam Updated Vital Signs BP 129/65 (BP Location: Right  Arm)   Pulse 95   Temp 98.8 F (37.1 C) (Oral)   Resp (!) 24   Ht 5\' 7"  (1.702 m)   Wt 103.4 kg   LMP 03/02/2019 (Exact Date)   SpO2 100%   BMI 35.71 kg/m   Physical Exam Vitals signs and nursing note reviewed. Exam conducted with a chaperone present (Female ED tech).  Constitutional:      General: She is not in acute distress.    Appearance: She is well-developed. She is not diaphoretic.     Comments: Generally well-appearing  HENT:     Head: Normocephalic and atraumatic.  Eyes:     General: No scleral icterus.       Right eye: No discharge.        Left eye: No discharge.     Conjunctiva/sclera: Conjunctivae normal.  Neck:     Musculoskeletal: Normal range of motion.  Cardiovascular:     Rate and Rhythm: Normal rate and regular rhythm.  Pulmonary:     Effort: Pulmonary effort is normal. No respiratory distress.     Breath  sounds: No stridor.  Abdominal:     General: Abdomen is flat. Bowel sounds are normal. There is no distension.     Palpations: Abdomen is soft.     Comments: There is mild tenderness to palpation over suprapubic area, bilateral inguinal areas left worse than right.  No hernias palpated.  Area of most tenderness is the left groin.  Genitourinary:    General: Normal vulva.     Vagina: Normal. No vaginal discharge.     Cervix: No cervical motion tenderness.     Uterus: Normal.      Adnexa: Right adnexa normal and left adnexa normal.       Right: No mass, tenderness or fullness.         Left: No mass, tenderness or fullness.    Musculoskeletal:        General: No deformity.  Skin:    General: Skin is warm and dry.  Neurological:     General: No focal deficit present.     Mental Status: She is alert.     Motor: No weakness or abnormal muscle tone.  Psychiatric:        Mood and Affect: Mood normal.        Behavior: Behavior normal.      ED Treatments / Results  Labs (all labs ordered are listed, but only abnormal results are displayed) Labs Reviewed  WET PREP, GENITAL - Abnormal; Notable for the following components:      Result Value   Clue Cells Wet Prep HPF POC PRESENT (*)    WBC, Wet Prep HPF POC MODERATE (*)    All other components within normal limits  COMPREHENSIVE METABOLIC PANEL - Abnormal; Notable for the following components:   Calcium 8.8 (*)    Albumin 3.0 (*)    AST 12 (*)    All other components within normal limits  CBC WITH DIFFERENTIAL/PLATELET - Abnormal; Notable for the following components:   RBC 3.52 (*)    Hemoglobin 10.5 (*)    HCT 32.6 (*)    All other components within normal limits  HCG, QUANTITATIVE, PREGNANCY - Abnormal; Notable for the following components:   hCG, Beta Chain, Quant, S 38,253 (*)    All other components within normal limits  URINALYSIS, ROUTINE W REFLEX MICROSCOPIC  GC/CHLAMYDIA PROBE AMP ([place]) NOT AT [facility]    EKG  None  Radiology  [place] Ob Limited > 14 Wks  Result Date: 06/10/2019 CLINICAL DATA:  Pelvic pain for 4 days, history of prior miscarriage. EXAM: LIMITED OBSTETRIC ULTRASOUND FINDINGS: Number of Fetuses: 1 Heart Rate:  146 bpm Movement: Yes Presentation: Cephalic Placental Location: Fundal and biased as described above. To the right. Previa: None Amniotic Fluid (Subjective):  Within normal limits. Femur length 1.5 cm: Estimated 14 weeks 3 days. Biparietal diameter not obtainable. MATERNAL FINDINGS: Cervix:  Appears closed. Uterus/Adnexae: No abnormality visualized. IMPRESSION: 1. Single live intrauterine pregnancy in cephalic position as described above. 2. Cervix is closed. This exam is performed on an emergent basis and does not comprehensively evaluate fetal size, dating, or anatomy; follow-up complete OB US should be considered if further fetal assessment is warranted. Electronically Signed   By: Donzetta KohutGeoffrey  Wile M.D.   On: 06/10/2019 17:57    Procedures Procedures (including critical care time)  Medications Ordered in ED Medications  sodium chloride 0.9 % bolus 1,000 mL (0 mLs Intravenous Stopped 06/10/19 1848)  metroNIDAZOLE (FLAGYL) tablet 500 mg (500 mg Oral Given 06/10/19 2024)     Initial Impression / Assessment and Plan / ED Course  I have reviewed the triage vital signs and the nursing notes.  Pertinent labs & imaging results that were available during my care of the patient were reviewed by me and considered in my medical decision making (see chart for details).       Patient presents today for evaluation of bilateral inguinal pain in the setting of pregnancy.  On exam she has tenderness to palpation in the left inguinal area.  Labs were obtained and reviewed, mild anemia with a hemoglobin of 10.5, however    otherwise CBC and CMP are unremarkable.  hCG quant is elevated at 38k.  Ultrasound is reassuring.  UA with out evidence of infection.  Wet prep shows clue cells consistent with BV.   We discussed risks and benefits of treatment and she elected for treatment with p.o. Flagyl.  We discussed the importance of obtaining outpatient OB/GYN follow-up in addition to utilizing MAU if needed for further pregnancy related emergent medical needs.    Suspect round ligament pain vs msk pain.  No evidence of PID.    Return precautions were discussed with patient who states their understanding.  At the time of discharge patient denied any unaddressed complaints or concerns.  Patient is agreeable for discharge home.    Final Clinical Impressions(s) / ED Diagnoses   Final diagnoses:  BV (bacterial vaginosis)  Bilateral groin pain  [redacted] weeks gestation of pregnancy    ED Discharge Orders         Ordered    metroNIDAZOLE (FLAGYL) 500 MG tablet  2 times daily     06/10/19 2001           Norman ClayHammond,  W, PA-C 06/10/19 2322    Wynetta FinesMessick, Peter C, MD 06/13/19 1028

## 2019-06-10 NOTE — ED Triage Notes (Signed)
Patient states she is [redacted] weeks pregnant and has pressure in her vaginal area .patient states "this is my 7th pregnancy, 3rd living kid and I usually don't feel this type of pressure until the end". Patient says it is not painful, but uncomfortable, especially when changing positions.

## 2019-06-11 ENCOUNTER — Inpatient Hospital Stay (HOSPITAL_COMMUNITY)
Admission: AD | Admit: 2019-06-11 | Discharge: 2019-06-11 | Disposition: A | Discharge status: Home / Self Care | Payer: Self-pay | Attending: Obstetrics & Gynecology | Admitting: Obstetrics & Gynecology

## 2019-06-11 ENCOUNTER — Other Ambulatory Visit: Payer: Self-pay

## 2019-06-11 ENCOUNTER — Encounter (HOSPITAL_COMMUNITY): Payer: Self-pay

## 2019-06-11 DIAGNOSIS — O209 Hemorrhage in early pregnancy, unspecified: Secondary | ICD-10-CM | POA: Insufficient documentation

## 2019-06-11 DIAGNOSIS — Z79899 Other long term (current) drug therapy: Secondary | ICD-10-CM | POA: Insufficient documentation

## 2019-06-11 DIAGNOSIS — R1030 Lower abdominal pain, unspecified: Secondary | ICD-10-CM | POA: Insufficient documentation

## 2019-06-11 DIAGNOSIS — N760 Acute vaginitis: Secondary | ICD-10-CM | POA: Insufficient documentation

## 2019-06-11 DIAGNOSIS — R102 Pelvic and perineal pain: Secondary | ICD-10-CM | POA: Insufficient documentation

## 2019-06-11 DIAGNOSIS — N949 Unspecified condition associated with female genital organs and menstrual cycle: Secondary | ICD-10-CM

## 2019-06-11 DIAGNOSIS — B9689 Other specified bacterial agents as the cause of diseases classified elsewhere: Secondary | ICD-10-CM | POA: Insufficient documentation

## 2019-06-11 DIAGNOSIS — Z3A14 14 weeks gestation of pregnancy: Secondary | ICD-10-CM | POA: Insufficient documentation

## 2019-06-11 DIAGNOSIS — O26892 Other specified pregnancy related conditions, second trimester: Secondary | ICD-10-CM | POA: Insufficient documentation

## 2019-06-11 MED ORDER — COMFORT FIT MATERNITY SUPP LG MISC: 1.0000 [IU] | Freq: Every day | 0 refills | Status: DC | PRN

## 2019-06-11 NOTE — Discharge Instructions (Signed)
For maternity belt:  [facility] [address], [place], [place] [address] Phone: [phone]  Monday     8:30AM-5PM Tuesday 8:30AM-5PM Wednesday 8:30AM-5PM Thursday 8:30AM-5PM Friday  8:30AM-5PM Saturday Closed Sunday Closed    Round Ligament Pain  The round ligament is a cord of muscle and tissue that helps support the uterus. It can become a source of pain during pregnancy if it becomes stretched or twisted as the baby grows. The pain usually begins in the second trimester (13-28 weeks) of pregnancy, and it can come and go until the baby is delivered. It is not a serious problem, and it does not cause harm to the baby. Round ligament pain is usually a short, sharp, and pinching pain, but it can also be a dull, lingering, and aching pain. The pain is felt in the lower side of the abdomen or in the groin. It usually starts deep in the groin and moves up to the outside of the hip area. The pain may occur when you:  Suddenly change position, such as quickly going from a sitting to standing position.  Roll over in bed.  Cough or sneeze.  Do physical activity. Follow these instructions at home:   Watch your condition for any changes.  When the pain starts, relax. Then try any of these methods to help with the pain: ? Sitting down. ? Flexing your knees up to your abdomen. ? Lying on your side with one pillow under your abdomen and another pillow between your legs. ? Sitting in a warm bath for 15-20 minutes or until the pain goes away.  Take over-the-counter and prescription medicines only as told by your health care provider.  Move slowly when you sit down or stand up.  Avoid long walks if they cause pain.  Stop or reduce your physical activities if they cause pain.  Keep all follow-up visits as told by your health care provider. This is important. Contact a health care provider if:  Your pain does not go away with treatment.  You feel pain in  your back that you did not have before.  Your medicine is not helping. Get help right away if:  You have a fever or chills.  You develop uterine contractions.  You have vaginal bleeding.  You have nausea or vomiting.  You have diarrhea.  You have pain when you urinate. Summary  Round ligament pain is felt in the lower abdomen or groin. It is usually a short, sharp, and pinching pain. It can also be a dull, lingering, and aching pain.  This pain usually begins in the second trimester (13-28 weeks). It occurs because the uterus is stretching with the growing baby, and it is not harmful to the baby.  You may notice the pain when you suddenly change position, when you cough or sneeze, or during physical activity.  Relaxing, flexing your knees to your abdomen, lying on one side, or taking a warm bath may help to get rid of the pain.  Get help from your health care provider if the pain does not go away or if you have vaginal bleeding, nausea, vomiting, diarrhea, or painful urination. This information is not intended to replace advice given to you by your health care provider. Make sure you discuss any questions you have with your health care provider. Document Released: 04/01/2008 Document Revised: 12/09/2017 Document Reviewed: 12/09/2017 Elsevier Patient Education  2020 [employer].

## 2019-06-11 NOTE — MAU Note (Addendum)
Julia Salinas is a 23 y.o. at [redacted]w[redacted]d here in MAU reporting: this AM when she wiped she saw a little bit of blood on the toilet paper. Has not seen anymore bleeding. Having increased abdominal pain since ER visit yesterday. Was Rx Flagyl yesterday but did not pick it up.  Was getting care in [place] and she said she called them and they told her that she needed to get checked again before starting medication. Thinks she is not going back to [place] and will continue [facility] here.   Onset of complaint: getting worse  Pain score: 8/10  Vitals:   06/11/19 1709  BP: 119/66  Pulse: 82  Resp: 16  Temp: 98.2 F (36.8 C)  SpO2: 100%     Lab orders placed from triage: UA, unable to give sample at this time

## 2019-06-11 NOTE — MAU Provider Note (Signed)
History     CSN: [id number]  Arrival date and time: 06/11/19 1647   First Provider Initiated Contact with Patient 06/11/19 1727      Chief Complaint  Patient presents with  . Abdominal Pain  . Vaginal Bleeding   HPI Ms. Julia Salinas is a 23 y.o. G2X5284 at [redacted]w[redacted]d who presents to MAU today with complaint of pelvic pressure and lower abdominal pain x 5 days. The patient was seen in the [facility] yesterday and had a normal [place]. Her labs were normal aside from +BV. She was given Rx for Flagyl, but she has not started it yet. She noted scant spotting this morning, but her cervix was checked yesterday. She denies pain at rest, but pressure is still present. She states pain is with ambulation and change of positions. She has not tried any pain medication. She denies UTI symptoms, N/V/D or fever.   OB History    Gravida  7   Para  2   Term  2   Preterm      AB  4   Living  2     SAB  2   TAB  2   Ectopic      Multiple      Live Births  2           Past Medical History:  Diagnosis Date  . Chronic anemia     Past Surgical History:  Procedure Laterality Date  . DILATION AND CURETTAGE OF UTERUS      Family History  Problem Relation Age of Onset  . Heart failure Mother   . Lupus Father     Social History   Tobacco Use  . Smoking status: Never Smoker  . Smokeless tobacco: Never Used  Substance Use Topics  . Alcohol use: Never    Frequency: Never  . Drug use: Never    Allergies:  Allergies  Allergen Reactions  . Penicillins     childhood reaction.    Medications Prior to Admission  Medication Sig Dispense Refill Last Dose  . metroNIDAZOLE (FLAGYL) 500 MG tablet Take 1 tablet (500 mg total) by mouth 2 (two) times daily. 13 tablet 0   . potassium chloride SA (KLOR-CON) 20 MEQ tablet Take 1 tablet (20 mEq total) by mouth daily. (Patient not taking: Reported on 06/10/2019) 5 tablet 0   . Prenatal Vit-Fe Fumarate-FA (MULTIVITAMIN-PRENATAL) 27-0.8 MG TABS  tablet Take 1 tablet by mouth daily.       Review of Systems  Constitutional: Negative for fever.  Gastrointestinal: Positive for abdominal pain. Negative for constipation, diarrhea, nausea and vomiting.  Genitourinary: Negative for dysuria, frequency, urgency, vaginal bleeding and vaginal discharge.   Physical Exam   Blood pressure 120/63, pulse 77, temperature 98.2 F (36.8 C), temperature source Oral, resp. rate 16, height 5\' 7"  (1.702 m), weight 102.4 kg, last menstrual period 03/02/2019, SpO2 100 %.  Physical Exam  Nursing note and vitals reviewed. Constitutional: She is oriented to person, place, and time. She appears well-developed and well-nourished. No distress.  HENT:  Head: Normocephalic and atraumatic.  Cardiovascular: Normal rate.  Respiratory: Effort normal.  GI: Soft. She exhibits no distension and no mass. There is no abdominal tenderness. There is no rebound and no guarding.  Neurological: She is alert and oriented to person, place, and time.  Skin: Skin is warm and dry. No erythema.  Psychiatric: She has a normal mood and affect.  Dilation: Closed Effacement (%): Thick Cervical Position: Posterior Exam by:: [email or web address]  Hillard Danker, [place]   Results for orders placed or performed during the hospital encounter of 06/10/19 (from the past 24 hour(s))  Urinalysis, Routine w reflex microscopic     Status: None   Collection Time: 06/10/19  6:29 PM  Result Value Ref Range   Color, Urine YELLOW YELLOW   APPearance CLEAR CLEAR   Specific Gravity, Urine 1.020 1.005 - 1.030   pH 7.0 5.0 - 8.0   Glucose, UA NEGATIVE NEGATIVE mg/dL   Hgb urine dipstick NEGATIVE NEGATIVE   Bilirubin Urine NEGATIVE NEGATIVE   Ketones, ur NEGATIVE NEGATIVE mg/dL   Protein, ur NEGATIVE NEGATIVE mg/dL   Nitrite NEGATIVE NEGATIVE   Leukocytes,Ua NEGATIVE NEGATIVE    MAU Course  Procedures None  MDM Patient was seen at [facility] yesterday with same complaint. Chart reviewed.  FHR - 151 bpm with doppler  today  Cervix closed Patient plans to stay in  for the remainder of her pregnancy. I have sent a message to the [place] office to set her up with a new OB appointment with a MD due to her history of second trimester miscarriage.  Assessment and Plan  A: SIUP at [redacted]w[redacted]d Round ligament pain  H/O second trimester miscarriage  Bacterial vaginosis   P:  Discharge home Recommended taking Flagyl given yesterday  Discussed Tylenol and hydrotherapy for pain Rx for abdominal binder given with information for [employer] message sent to [facility] for new OB appointment. Patient has applied for MCD, but is not active yet.  Second trimester precautions discussed Patient advised to follow-up with [facility]  Patient may return to MAU as needed or if her condition were to change or worsen  Kerry Hough, PA-C 06/11/2019, 5:58 PM

## 2019-06-13 LAB — GC/CHLAMYDIA PROBE AMP (~~LOC~~) NOT AT ARMC
Chlamydia: NEGATIVE
Neisseria Gonorrhea: NEGATIVE

## 2019-06-14 ENCOUNTER — Encounter (HOSPITAL_COMMUNITY): Payer: Self-pay

## 2019-06-14 ENCOUNTER — Inpatient Hospital Stay (HOSPITAL_COMMUNITY)
Admission: AD | Admit: 2019-06-14 | Discharge: 2019-06-14 | Disposition: A | Discharge status: Home / Self Care | Payer: Self-pay | Attending: Obstetrics & Gynecology | Admitting: Obstetrics & Gynecology

## 2019-06-14 ENCOUNTER — Other Ambulatory Visit: Payer: Self-pay

## 2019-06-14 DIAGNOSIS — R102 Pelvic and perineal pain: Secondary | ICD-10-CM | POA: Insufficient documentation

## 2019-06-14 DIAGNOSIS — Z3A14 14 weeks gestation of pregnancy: Secondary | ICD-10-CM

## 2019-06-14 DIAGNOSIS — R1030 Lower abdominal pain, unspecified: Secondary | ICD-10-CM | POA: Insufficient documentation

## 2019-06-14 DIAGNOSIS — O26892 Other specified pregnancy related conditions, second trimester: Secondary | ICD-10-CM | POA: Insufficient documentation

## 2019-06-14 DIAGNOSIS — N949 Unspecified condition associated with female genital organs and menstrual cycle: Secondary | ICD-10-CM

## 2019-06-14 DIAGNOSIS — Z79899 Other long term (current) drug therapy: Secondary | ICD-10-CM | POA: Insufficient documentation

## 2019-06-14 DIAGNOSIS — M545 Low back pain: Secondary | ICD-10-CM | POA: Insufficient documentation

## 2019-06-14 LAB — URINALYSIS, ROUTINE W REFLEX MICROSCOPIC
Bilirubin Urine: NEGATIVE
Glucose, UA: NEGATIVE mg/dL
Hgb urine dipstick: NEGATIVE
Ketones, ur: NEGATIVE mg/dL
Leukocytes,Ua: NEGATIVE
Nitrite: NEGATIVE
Protein, ur: 30 mg/dL — AB
Specific Gravity, Urine: 1.029 (ref 1.005–1.030)
pH: 5 (ref 5.0–8.0)

## 2019-06-14 MED ORDER — CYCLOBENZAPRINE HCL 10 MG PO TABS: 10.0000 mg | ORAL_TABLET | Freq: Two times a day (BID) | ORAL | 0 refills | Status: AC | PRN

## 2019-06-14 MED ORDER — CYCLOBENZAPRINE HCL 10 MG PO TABS
10.0000 mg | ORAL_TABLET | Freq: Once | ORAL | Status: AC
  Administered 2019-06-14: 10 mg via ORAL
  Filled 2019-06-14: qty 1

## 2019-06-14 NOTE — Discharge Instructions (Signed)

## 2019-06-14 NOTE — MAU Note (Signed)
.   Julia Salinas is a 23 y.o. at [redacted]w[redacted]d here in MAU reporting:lower back pain and pelvic pressure for a week. Denies any vaginal bleeding or abnormal discharge  Onset of complaint: ongoing for a week Pain score: 8 Vitals:   06/14/19 1412 06/14/19 1413  BP:  127/65  Pulse:  91  Resp:  18  Temp:  98.4 F (36.9 C)  SpO2: 100%      FHT: Lab orders placed from triage: UA

## 2019-06-14 NOTE — MAU Provider Note (Signed)
History     CSN: [id number]  Arrival date and time: 06/14/19 1331   First Provider Initiated Contact with Patient 06/14/19 1518      Chief Complaint  Patient presents with  . Back Pain  . Pelvic Pain   Julia Salinas is a 23 y.o. G2R4270 at [redacted]w[redacted]d who presents today with lower back pain that radiates to the lower abdomen. She reports that she has had this pain for about one week, and it is gradually getting worse. She was seen here and the ED for this same pain on 12/4 and 12/5. She reports that this is the same pain that she was seen for on those days. She was given a RX for a belly band that she has not gotten yet. She states that she cannot take tylenol because it does not help.   Back Pain This is a new problem. The current episode started in the past 7 days. The problem occurs constantly. The problem is unchanged. The pain is present in the lumbar spine. Radiates to: lower abdomen. The pain is at a severity of 8/10. Associated symptoms include pelvic pain. Pertinent negatives include no dysuria or fever. She has tried nothing for the symptoms.  Pelvic Pain The patient's primary symptoms include pelvic pain. The patient's pertinent negatives include no vaginal discharge. This is a new problem. The current episode started in the past 7 days. The problem occurs constantly. The problem has been unchanged. Associated symptoms include back pain and nausea. Pertinent negatives include no chills, constipation (last BM 06/13/2019), diarrhea, dysuria, fever, frequency or vomiting. Nothing aggravates the symptoms. She has tried nothing for the symptoms.    OB History    Gravida  7   Para  2   Term  2   Preterm      AB  4   Living  2     SAB  2   TAB  2   Ectopic      Multiple      Live Births  2           Past Medical History:  Diagnosis Date  . Chronic anemia     Past Surgical History:  Procedure Laterality Date  . DILATION AND CURETTAGE OF UTERUS      Family  History  Problem Relation Age of Onset  . Heart failure Mother   . Lupus Father     Social History   Tobacco Use  . Smoking status: Never Smoker  . Smokeless tobacco: Never Used  Substance Use Topics  . Alcohol use: Never    Frequency: Never  . Drug use: Never    Allergies:  Allergies  Allergen Reactions  . Penicillins     childhood reaction.    Medications Prior to Admission  Medication Sig Dispense Refill Last Dose  . metroNIDAZOLE (FLAGYL) 500 MG tablet Take 1 tablet (500 mg total) by mouth 2 (two) times daily. 13 tablet 0 06/14/2019 at Unknown time  . Prenatal Vit-Fe Fumarate-FA (MULTIVITAMIN-PRENATAL) 27-0.8 MG TABS tablet Take 1 tablet by mouth daily.   06/14/2019 at Unknown time  . Elastic Bandages & Supports (COMFORT FIT MATERNITY SUPP LG) MISC 1 Units by Does not apply route daily as needed. 1 each 0   . potassium chloride SA (KLOR-CON) 20 MEQ tablet Take 1 tablet (20 mEq total) by mouth daily. (Patient not taking: Reported on 06/10/2019) 5 tablet 0     Review of Systems  Constitutional: Negative for chills and fever.  Gastrointestinal: Positive for nausea. Negative for constipation (last BM 06/13/2019), diarrhea and vomiting.  Genitourinary: Positive for pelvic pain. Negative for dysuria, frequency, vaginal bleeding and vaginal discharge.  Musculoskeletal: Positive for back pain.   Physical Exam   Blood pressure 127/65, pulse 91, temperature 98.4 F (36.9 C), resp. rate 18, weight 101.6 kg, last menstrual period 03/02/2019, SpO2 100 %.  Physical Exam  Nursing note and vitals reviewed. Constitutional: She is oriented to person, place, and time. She appears well-developed and well-nourished. No distress.  HENT:  Head: Normocephalic.  Cardiovascular: Normal rate.  Respiratory: Effort normal.  GI: Soft. There is no abdominal tenderness.  Neurological: She is alert and oriented to person, place, and time.  Skin: Skin is warm and dry.  Psychiatric: She has a  normal mood and affect.     Results for orders placed or performed during the hospital encounter of 06/14/19 (from the past 24 hour(s))  Urinalysis, Routine w reflex microscopic     Status: Abnormal   Collection Time: 06/14/19  2:33 PM  Result Value Ref Range   Color, Urine YELLOW YELLOW   APPearance HAZY (A) CLEAR   Specific Gravity, Urine 1.029 1.005 - 1.030   pH 5.0 5.0 - 8.0   Glucose, UA NEGATIVE NEGATIVE mg/dL   Hgb urine dipstick NEGATIVE NEGATIVE   Bilirubin Urine NEGATIVE NEGATIVE   Ketones, ur NEGATIVE NEGATIVE mg/dL   Protein, ur 30 (A) NEGATIVE mg/dL   Nitrite NEGATIVE NEGATIVE   Leukocytes,Ua NEGATIVE NEGATIVE   RBC / HPF 0-5 0 - 5 RBC/hpf   WBC, UA 0-5 0 - 5 WBC/hpf   Bacteria, UA RARE (A) NONE SEEN   Squamous Epithelial / LPF 0-5 0 - 5   Mucus PRESENT    Hyaline Casts, UA PRESENT    Pt informed that the ultrasound is considered a limited OB ultrasound and is not intended to be a complete ultrasound exam.  Patient also informed that the ultrasound is not being completed with the intent of assessing for fetal or placental anomalies or any pelvic abnormalities.  Explained that the purpose of today's ultrasound is to assess for  viability.  Patient acknowledges the purpose of the exam and the limitations of the study.    +FHT 140 with [place]   MAU Course  Procedures  MDM Patient has had flexeril. She reports that her pain has improved.   Assessment and Plan   1. Round ligament pain   2. [redacted] weeks gestation of pregnancy    DC home Comfort measures reviewed  2nd Trimester precautions  PTL precautions  Fetal kick counts RX: flexeril PRN #[id number]  Return to MAU as needed FU with OB as planned  Follow-up Information    [facility] for [facility]. Schedule an appointment as soon as possible for a visit.   Specialty: Obstetrics and Gynecology Contact information: [address] [place] [address], [address] [id number] [place]  [id number] [phone]         Thressa Sheller DNP, CNM  06/14/19  4:46 PM

## 2019-06-22 ENCOUNTER — Telehealth (INDEPENDENT_AMBULATORY_CARE_PROVIDER_SITE_OTHER): Payer: Self-pay | Admitting: Family Medicine

## 2019-06-22 DIAGNOSIS — O0990 Supervision of high risk pregnancy, unspecified, unspecified trimester: Secondary | ICD-10-CM

## 2019-06-22 NOTE — Telephone Encounter (Signed)
Patient was called to confirm her telephone visit on 12/18 @ 8:30. Patient verbalized understanding. Patient stated that she has been experiencing a lot of pressure and does not want to keep going to the ER because that is a bill. She stated sometimes it is very uncomfortable and unbearable. Patient instructed that I would send a message to the nurses and they will contact her back. Patient verbalized understanding. Patient in currently pregnant and intake appointment is on 12/18

## 2019-06-23 NOTE — Telephone Encounter (Signed)
Pt reports she is experiencing pressure in her abdomen. She reports she was prescribed muscle relaxers and it is not working as well when she walks. The pain is still there but more tolerable. She reports the pressure is still there. Pt is not working. Pt has tried Tylenol and it does not help.   Pt has a history of a 2nd trimester miscarriage and is concerned that this feels much like that pregnancy. Pt reports she was told that she may need a cerclage due to 2nd trimester loss. Pt is very concerned that something is wrong with this pregnancy.   Pt reports some bleeding with wiping every morning. She is not sexually active currently.   Informed pt that she will get a call tomorrow for a nurse intake and she has an appt in the office on Monday. Pt voiced understanding.   Reviewed with pt that if she has increasing pain, vaginal discharge or bleeding then she is to go back to the MAU For evaluation. Pt voiced understanding.

## 2019-06-24 ENCOUNTER — Ambulatory Visit (INDEPENDENT_AMBULATORY_CARE_PROVIDER_SITE_OTHER): Payer: Self-pay | Admitting: *Deleted

## 2019-06-24 ENCOUNTER — Other Ambulatory Visit: Payer: Self-pay

## 2019-06-24 DIAGNOSIS — O99210 Obesity complicating pregnancy, unspecified trimester: Secondary | ICD-10-CM

## 2019-06-24 DIAGNOSIS — K2970 Gastritis, unspecified, without bleeding: Secondary | ICD-10-CM | POA: Insufficient documentation

## 2019-06-24 DIAGNOSIS — O09899 Supervision of other high risk pregnancies, unspecified trimester: Secondary | ICD-10-CM | POA: Insufficient documentation

## 2019-06-24 DIAGNOSIS — K2900 Acute gastritis without bleeding: Secondary | ICD-10-CM

## 2019-06-24 DIAGNOSIS — Z3490 Encounter for supervision of normal pregnancy, unspecified, unspecified trimester: Secondary | ICD-10-CM

## 2019-06-24 DIAGNOSIS — D649 Anemia, unspecified: Secondary | ICD-10-CM | POA: Insufficient documentation

## 2019-06-24 NOTE — Patient Instructions (Signed)

## 2019-06-24 NOTE — Progress Notes (Signed)
0830 I called Julia Salinas for her new ob intake and left a message I am calling for her telephone visit and since I did not reach her , I will call again in a few minutes- please be available by phone.  Julia Trego,RN  I connected with  Julia Salinas on 06/24/19 at  8:30 AM EST by telephone and verified that I am speaking with the correct person using two identifiers.   I discussed the limitations, risks, security and privacy concerns of performing an evaluation and management service by telephone and the availability of in person appointments. I also discussed with the patient that there may be a patient responsible charge related to this service. The patient expressed understanding and agreed to proceed. Explained I am completing her New OB Intake today. We discussed Her EDD and that it is based on  sure LMP . I reviewed her allergies, meds, OB History, Medical /Surgical history, and appropriate screenings. I explained I will send her the Babyscripts app- app sent to her while on phone.  I explained we will send a blood pressure cuff to [facility] once she has her medicaid approved ( she has applied). I asked her to bring the blood pressure cuff with her to her next  ob appointment once she receives the cuff so we can show her how to use it. Explained  then we will have her take her blood pressure weekly and enter into the app. Explained she will have some visits in office and some virtually. She already has Community education officer. Reviewed appointment date/ time with her , our location and to wear mask, no visitors. Explained she will have exam, ob bloodwork if needed- she states some was done in [place] where she started her prenatal care. She states she knows they did the gender genetic test and other labs- she will sign a release when she comes in Monday 12/ 21/20 for her first ob appt with [place].  I scheduled an [place] at 19 weeks  In afternoon per her request and gave her the appointment. She voices understanding.    Julia Life,RN 06/24/2019  8:38 AM

## 2019-06-27 ENCOUNTER — Encounter: Payer: Self-pay | Admitting: Family Medicine

## 2019-06-27 NOTE — Progress Notes (Signed)
Chart reviewed for nurse visit. Agree with plan of care.   Nainoa Woldt L, DO 06/27/2019 12:08 PM

## 2019-06-28 ENCOUNTER — Telehealth: Payer: Self-pay | Admitting: Obstetrics and Gynecology

## 2019-06-28 ENCOUNTER — Encounter (INDEPENDENT_AMBULATORY_CARE_PROVIDER_SITE_OTHER): Payer: Self-pay | Admitting: Obstetrics and Gynecology

## 2019-06-28 NOTE — Telephone Encounter (Signed)
Received a call from [place] stating she had missed her appointment on yesterday due to not knowing where we were located. Then today she overslept. Appointment has been rescheduled to Monday the 28th at 9:55 am.

## 2019-06-28 NOTE — Progress Notes (Signed)
Patient did not keep her appointment today.   Lezlie Lye, NP 06/28/2019 2:28 PM

## 2019-07-04 ENCOUNTER — Other Ambulatory Visit: Payer: Self-pay

## 2019-07-04 ENCOUNTER — Ambulatory Visit (INDEPENDENT_AMBULATORY_CARE_PROVIDER_SITE_OTHER): Payer: Self-pay | Admitting: Nurse Practitioner

## 2019-07-04 VITALS — BP 107/62 | HR 71 | Wt 229.0 lb

## 2019-07-04 DIAGNOSIS — Z3A17 17 weeks gestation of pregnancy: Secondary | ICD-10-CM

## 2019-07-04 DIAGNOSIS — O99891 Other specified diseases and conditions complicating pregnancy: Secondary | ICD-10-CM

## 2019-07-04 DIAGNOSIS — F419 Anxiety disorder, unspecified: Secondary | ICD-10-CM

## 2019-07-04 DIAGNOSIS — Z3490 Encounter for supervision of normal pregnancy, unspecified, unspecified trimester: Secondary | ICD-10-CM

## 2019-07-04 DIAGNOSIS — M549 Dorsalgia, unspecified: Secondary | ICD-10-CM

## 2019-07-04 DIAGNOSIS — G4709 Other insomnia: Secondary | ICD-10-CM

## 2019-07-04 DIAGNOSIS — O99342 Other mental disorders complicating pregnancy, second trimester: Secondary | ICD-10-CM

## 2019-07-04 MED ORDER — DOCUSATE CALCIUM 240 MG PO CAPS: 240.0000 mg | ORAL_CAPSULE | Freq: Every day | ORAL | 2 refills | Status: AC

## 2019-07-04 NOTE — Patient Instructions (Addendum)
For assistance with applying for medicaid please call Julia Salinas [phone] or Julia Salinas [phone].   Back Exercises These exercises help to make your trunk and back strong. They also help to keep the lower back flexible. Doing these exercises can help to prevent back pain or lessen existing pain.  If you have back pain, try to do these exercises 2-3 times each day or as told by your doctor.  As you get better, do the exercises once each day. Repeat the exercises more often as told by your doctor.  To stop back pain from coming back, do the exercises once each day, or as told by your doctor. Exercises Single knee to chest Do these steps 3-5 times in a row for each leg: 1. Lie on your back on a firm bed or the floor with your legs stretched out. 2. Bring one knee to your chest. 3. Grab your knee or thigh with both hands and hold them it in place. 4. Pull on your knee until you feel a gentle stretch in your lower back or buttocks. 5. Keep doing the stretch for 10-30 seconds. 6. Slowly let go of your leg and straighten it. Pelvic tilt Do these steps 5-10 times in a row: 1. Lie on your back on a firm bed or the floor with your legs stretched out. 2. Bend your knees so they point up to the ceiling. Your feet should be flat on the floor. 3. Tighten your lower belly (abdomen) muscles to press your lower back against the floor. This will make your tailbone point up to the ceiling instead of pointing down to your feet or the floor. 4. Stay in this position for 5-10 seconds while you gently tighten your muscles and breathe evenly. Cat-cow Do these steps until your lower back bends more easily: 1. Get on your hands and knees on a firm surface. Keep your hands under your shoulders, and keep your knees under your hips. You may put padding under your knees. 2. Let your head hang down toward your chest. Tighten (contract) the muscles in your belly. Point your tailbone toward the floor so your lower  back becomes rounded like the back of a cat. 3. Stay in this position for 5 seconds. 4. Slowly lift your head. Let the muscles of your belly relax. Point your tailbone up toward the ceiling so your back forms a sagging arch like the back of a cow. 5. Stay in this position for 5 seconds.  Press-ups Do these steps 5-10 times in a row: 1. Lie on your belly (face-down) on the floor. 2. Place your hands near your head, about shoulder-width apart. 3. While you keep your back relaxed and keep your hips on the floor, slowly straighten your arms to raise the top half of your body and lift your shoulders. Do not use your back muscles. You may change where you place your hands in order to make yourself more comfortable. 4. Stay in this position for 5 seconds. 5. Slowly return to lying flat on the floor.  Bridges Do these steps 10 times in a row: 1. Lie on your back on a firm surface. 2. Bend your knees so they point up to the ceiling. Your feet should be flat on the floor. Your arms should be flat at your sides, next to your body. 3. Tighten your butt muscles and lift your butt off the floor until your waist is almost as high as your knees. If you do not feel  the muscles working in your butt and the back of your thighs, slide your feet 1-2 inches farther away from your butt. 4. Stay in this position for 3-5 seconds. 5. Slowly lower your butt to the floor, and let your butt muscles relax. If this exercise is too easy, try doing it with your arms crossed over your chest. Belly crunches Do these steps 5-10 times in a row: 1. Lie on your back on a firm bed or the floor with your legs stretched out. 2. Bend your knees so they point up to the ceiling. Your feet should be flat on the floor. 3. Cross your arms over your chest. 4. Tip your chin a little bit toward your chest but do not bend your neck. 5. Tighten your belly muscles and slowly raise your chest just enough to lift your shoulder blades a tiny bit  off of the floor. Avoid raising your body higher than that, because it can put too much stress on your low back. 6. Slowly lower your chest and your head to the floor. Back lifts Do these steps 5-10 times in a row: 1. Lie on your belly (face-down) with your arms at your sides, and rest your forehead on the floor. 2. Tighten the muscles in your legs and your butt. 3. Slowly lift your chest off of the floor while you keep your hips on the floor. Keep the back of your head in line with the curve in your back. Look at the floor while you do this. 4. Stay in this position for 3-5 seconds. 5. Slowly lower your chest and your face to the floor. Contact a doctor if:  Your back pain gets a lot worse when you do an exercise.  Your back pain does not get better 2 hours after you exercise. If you have any of these problems, stop doing the exercises. Do not do them again unless your doctor says it is okay. Get help right away if:  You have sudden, very bad back pain. If this happens, stop doing the exercises. Do not do them again unless your doctor says it is okay. This information is not intended to replace advice given to you by your health care provider. Make sure you discuss any questions you have with your health care provider. Document Released: 07/26/2010 Document Revised: 03/18/2018 Document Reviewed: 03/18/2018 Elsevier Patient Education  2020 [employer].  Back Pain in Pregnancy Back pain during pregnancy is common. Back pain may be caused by several factors that are related to changes during your pregnancy. Follow these instructions at home: Managing pain, stiffness, and swelling      If directed, for sudden (acute) back pain, put ice on the painful area. ? Put ice in a plastic bag. ? Place a towel between your skin and the bag. ? Leave the ice on for 20 minutes, 2-3 times per day.  If directed, apply heat to the affected area before you exercise. Use the heat source that your  health care provider recommends, such as a moist heat pack or a heating pad. ? Place a towel between your skin and the heat source. ? Leave the heat on for 20-30 minutes. ? Remove the heat if your skin turns bright red. This is especially important if you are unable to feel pain, heat, or cold. You may have a greater risk of getting burned.  If directed, massage the affected area. Activity  Exercise as told by your health care provider. Gentle exercise is the  best way to prevent or manage back pain.  Listen to your body when lifting. If lifting hurts, ask for help or bend your knees. This uses your leg muscles instead of your back muscles.  Squat down when picking up something from the floor. Do not bend over.  Only use bed rest for short periods as told by your health care provider. Bed rest should only be used for the most severe episodes of back pain. Standing, sitting, and lying down  Do not stand in one place for long periods of time.  Use good posture when sitting. Make sure your head rests over your shoulders and is not hanging forward. Use a pillow on your lower back if necessary.  Try sleeping on your side, preferably the left side, with a pregnancy support pillow or 1-2 regular pillows between your legs. ? If you have back pain after a night's rest, your bed may be too soft. ? A firm mattress may provide more support for your back during pregnancy. General instructions  Do not wear high heels.  Eat a healthy diet. Try to gain weight within your health care provider's recommendations.  Use a maternity girdle, elastic sling, or back brace as told by your health care provider.  Take over-the-counter and prescription medicines only as told by your health care provider.  Work with a physical therapist or massage therapist to find ways to manage back pain. Acupuncture or massage therapy may be helpful.  Keep all follow-up visits as told by your health care provider. This is  important. Contact a health care provider if:  Your back pain interferes with your daily activities.  You have increasing pain in other parts of your body. Get help right away if:  You develop numbness, tingling, weakness, or problems with the use of your arms or legs.  You develop severe back pain that is not controlled with medicine.  You have a change in bowel or bladder control.  You develop shortness of breath, dizziness, or you faint.  You develop nausea, vomiting, or sweating.  You have back pain that is a rhythmic, cramping pain similar to labor pains. Labor pain is usually 1-2 minutes apart, lasts for about 1 minute, and involves a bearing down feeling or pressure in your pelvis.  You have back pain and your water breaks or you have vaginal bleeding.  You have back pain or numbness that travels down your leg.  Your back pain developed after you fell.  You develop pain on one side of your back.  You see blood in your urine.  You develop skin blisters in the area of your back pain. Summary  Back pain may be caused by several factors that are related to changes during your pregnancy.  Follow instructions as told by your health care provider for managing pain, stiffness, and swelling.  Exercise as told by your health care provider. Gentle exercise is the best way to prevent or manage back pain.  Take over-the-counter and prescription medicines only as told by your health care provider.  Keep all follow-up visits as told by your health care provider. This is important. This information is not intended to replace advice given to you by your health care provider. Make sure you discuss any questions you have with your health care provider. Document Released: 10/01/2005 Document Revised: 10/12/2018 Document Reviewed: 12/09/2017 Elsevier Patient Education  2020 [employer]  Constipation, Adult Constipation is when a person:  Poops (has a bowel movement) fewer times  in a week than normal.  Has a hard time pooping.  Has poop that is dry, hard, or bigger than normal. Follow these instructions at home: Eating and drinking   Eat foods that have a lot of fiber, such as: ? Fresh fruits and vegetables. ? Whole grains. ? Beans.  Eat less of foods that are high in fat, low in fiber, or overly processed, such as: ? [place] fries. ? Hamburgers. ? Cookies. ? Candy. ? Soda.  Drink enough fluid to keep your pee (urine) clear or pale yellow. General instructions  Exercise regularly or as told by your doctor.  Go to the restroom when you feel like you need to poop. Do not hold it in.  Take over-the-counter and prescription medicines only as told by your doctor. These include any fiber supplements.  Do pelvic floor retraining exercises, such as: ? Doing deep breathing while relaxing your lower belly (abdomen). ? Relaxing your pelvic floor while pooping.  Watch your condition for any changes.  Keep all follow-up visits as told by your doctor. This is important. Contact a doctor if:  You have pain that gets worse.  You have a fever.  You have not pooped for 4 days.  You throw up (vomit).  You are not hungry.  You lose weight.  You are bleeding from the anus.  You have thin, pencil-like poop (stool). Get help right away if:  You have a fever, and your symptoms suddenly get worse.  You leak poop or have blood in your poop.  Your belly feels hard or bigger than normal (is bloated).  You have very bad belly pain.  You feel dizzy or you faint. This information is not intended to replace advice given to you by your health care provider. Make sure you discuss any questions you have with your health care provider. Document Released: 12/10/2007 Document Revised: 06/05/2017 Document Reviewed: 12/12/2015 Elsevier Patient Education  2020 [employer].

## 2019-07-04 NOTE — Progress Notes (Signed)
Genetics done in [place]

## 2019-07-04 NOTE — Progress Notes (Signed)
Subjective:   Julia Salinas is a 23 y.o. [id number] at [redacted]w[redacted]d by early ultrasound being seen today for her first obstetrical visit.  Her obstetrical history is significant for obesity and chronic back pain. Patient does intend to breast feed. Pregnancy history fully reviewed. Client had early prenatal care in [place] and now has moved to [place].  States she has to end Medicaid in [place] and then apply here.  Has tried to do that online but is unsure what progress has been made on getting her Medicaid in [place].  Patient reports backache and pelvic pain, decreased appetite and constipation.  HISTORY: OB History  Gravida Para Term Preterm AB Living  7 2 2  0 4 2  SAB TAB Ectopic Multiple Live Births  2 2 0 0 2    # Outcome Date GA Lbr Len/2nd Weight Sex Delivery Anes PTL Lv  7 Current           6 TAB 10/2018          5 SAB 09/2018 [redacted]w[redacted]d         4 TAB 07/2018          3 Term 2018 [redacted]w[redacted]d  6 lb 6 oz (2.892 kg) F Vag-Spont None  LIV     Birth Comments: leaking fluid at end of pregnancy  2 SAB 09/2015 [redacted]w[redacted]d            Birth Comments: had D&C  1 Term 2016 [redacted]w[redacted]d  8 lb 6 oz (3.799 kg) F Vag-Spont EPI  LIV     Birth Comments: no complications   Past Medical History:  Diagnosis Date  . Chronic anemia   . Gastritis    Past Surgical History:  Procedure Laterality Date  . DILATION AND CURETTAGE OF UTERUS     Family History  Problem Relation Age of Onset  . Heart failure Mother   . Diabetes Mother   . Lupus Father   . Kidney disease Father    Social History   Tobacco Use  . Smoking status: Never Smoker  . Smokeless tobacco: Never Used  Substance Use Topics  . Alcohol use: Never  . Drug use: Never   Allergies  Allergen Reactions  . Penicillins     childhood reaction.   Current Outpatient Medications on File Prior to Visit  Medication Sig Dispense Refill  . Prenatal Vit-Fe Fumarate-FA (MULTIVITAMIN-PRENATAL) 27-0.8 MG TABS tablet Take 1 tablet by mouth daily.    . cyclobenzaprine  (FLEXERIL) 10 MG tablet Take 1 tablet (10 mg total) by mouth 2 (two) times daily as needed for muscle spasms. (Patient not taking: Reported on 07/04/2019) 20 tablet 0  . Elastic Bandages & Supports (COMFORT FIT MATERNITY SUPP LG) MISC 1 Units by Does not apply route daily as needed. (Patient not taking: Reported on 06/24/2019) 1 each 0   No current facility-administered medications on file prior to visit.     Exam   Vitals:   07/04/19 1014  BP: 107/62  Pulse: 71  Weight: 229 lb (103.9 kg)   Fetal Heart Rate (bpm): 146  Uterus:  Fundal Height: 17 cm  Pelvic Exam: Perineum:    Vulva:    Vagina:     Cervix:    Adnexa:    Bony Pelvis:   System: General: well-developed, well-nourished female in no acute distress   Breast:     Skin: normal coloration and turgor, no rashes   Neurologic: oriented, normal, negative, normal mood   Extremities:  normal strength, tone, and muscle mass, ROM of all joints is normal   HEENT extraocular movement intact and sclera clear, anicteric   Mouth/Teeth    Neck supple and no masses, normal thyroid   Cardiovascular: regular rate and rhythm   Respiratory:  no respiratory distress, normal breath sounds   Abdomen: soft, non-tender; no masses,  no organomegaly  Will ask for prenatal records that will have exam included    Assessment:   Pregnancy: [id number] Patient Active Problem List   Diagnosis Date Noted  . Supervision of low-risk pregnancy 06/24/2019  . Obesity in pregnancy 06/24/2019  . Short interval between pregnancies affecting pregnancy, antepartum 06/24/2019  . Gastritis   . Chronic anemia      Plan:  1. Encounter for supervision of low-risk pregnancy, antepartum Early prenatal care in [place].  Will have front desk have her sign a ROI to get records.  States she had Pap smear in [place]. Reviewed MyChart app and Babyscripts App Given BP cuff and instructions on how to use the cuff given  - Ambulatory referral to [place]  2. Back pain affecting pregnancy in second trimester Has had constant back pain for awhile. Was seen in [facility]U for pain and given flexeril which makes her sleepy and she does not take very often. Client also has pubic symphysis pain and showed how to stretch back muscles when she is in the standing position.  Could give referral to physical therapy for assistance but without insurance client declines referral at present.  Also, has not gotten pregnancy support belt but will wait until Medicaid is active to reprint prescription for her.  3. Anxiety Has had a second trimester loss before and she seems to be thinking about it and worrying about the pregnancy. Has low level constant anxiety which seems to be triggered at night. Does not have panic attacks. Will refer to [facility] provider to evaluate anxiety and see what additional measures need to be incorporated into her plan.  - Ambulatory referral to [place]  4. Other insomnia Reviewed visualization and reading a book as techniques to deal with insomnia.  Advised client to try each one at least 3 times. Reviewed sleep in pregnancy is normally interrupted and different from sleep when not pregnant.  5.  Constipation Reviewed high fiber foods to include in diet and prescribed medication to take for constipation.  Initial labs drawn. Continue prenatal vitamins. Genetic Screening discussed, NIPS: declined. Ultrasound discussed; fetal anatomic survey: has been scheduled. Problem list reviewed and updated. The nature of [facility] with multiple MDs and other Advanced Practice Providers was explained to patient; also emphasized that residents, students are part of our team. Routine obstetric precautions reviewed. Return in about 4 weeks (around 08/01/2019) for virtual visit and sign ROI to get Prenatal records from [place].  Total face-to-face time with patient: 40 minutes.  Over 50%  of encounter was spent on counseling and coordination of care.     Earlie Server, FNP Family Nurse Practitioner, [facility] for [employer], [facility] 07/04/2019 12:17 PM

## 2019-07-06 ENCOUNTER — Telehealth: Payer: Self-pay | Admitting: Family Medicine

## 2019-07-06 ENCOUNTER — Telehealth: Payer: Self-pay

## 2019-07-06 ENCOUNTER — Other Ambulatory Visit: Payer: Self-pay

## 2019-07-06 DIAGNOSIS — Z3492 Encounter for supervision of normal pregnancy, unspecified, second trimester: Secondary | ICD-10-CM

## 2019-07-06 MED ORDER — COMFORT FIT MATERNITY SUPP LG MISC: 1.0000 [IU] | Freq: Once | 0 refills | Status: AC

## 2019-07-06 NOTE — Telephone Encounter (Signed)
Requesting a call back from the nurse. She is having pains at 18 weeks.

## 2019-07-06 NOTE — Telephone Encounter (Signed)
Called pt regarding pain that she is having, she stated the pain was not severe, just irritating and does not go away when she takes the muscle relaxer's that were Rx to her at her MAU visit  On12/11/2018. Spoke with Dr. Hulan Fray, and she advised that this is something the pt may have for a while. Dr. Hulan Fray also suggested pt to try a Belly band & Chiropractor. Pt verbalized understanding.

## 2019-07-19 ENCOUNTER — Ambulatory Visit (HOSPITAL_COMMUNITY): Payer: Self-pay

## 2019-08-02 ENCOUNTER — Telehealth: Payer: Self-pay | Admitting: Medical

## 2020-10-09 IMAGING — US US ABDOMEN LIMITED
1 series · 14 of 25 positions shown · non-contrast
Comparison: None.

CLINICAL DATA: Right upper quadrant pain for 3 days

EXAM:
ULTRASOUND ABDOMEN LIMITED RIGHT UPPER QUADRANT

[Series 1: us abdomen limited · 14 of 40 slices shown]
[im 1/40]
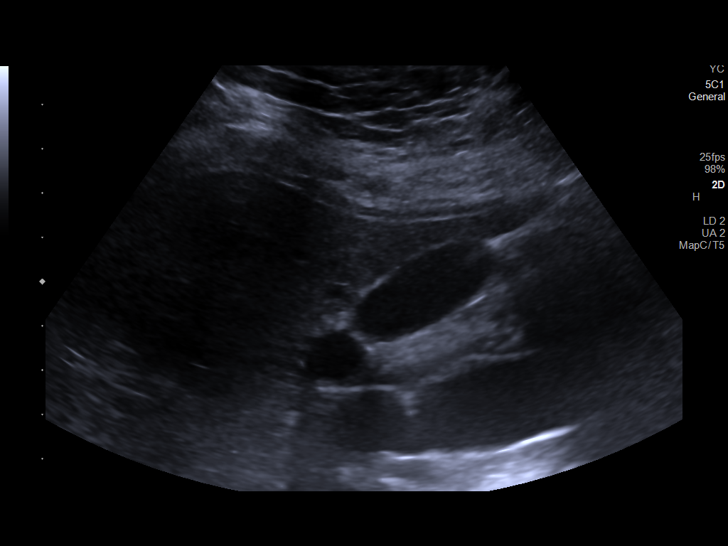
[im 4/40]
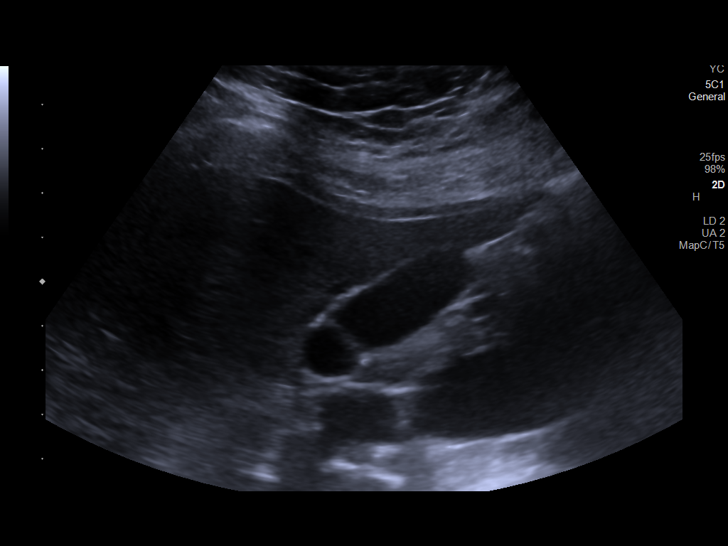
[im 7/40]
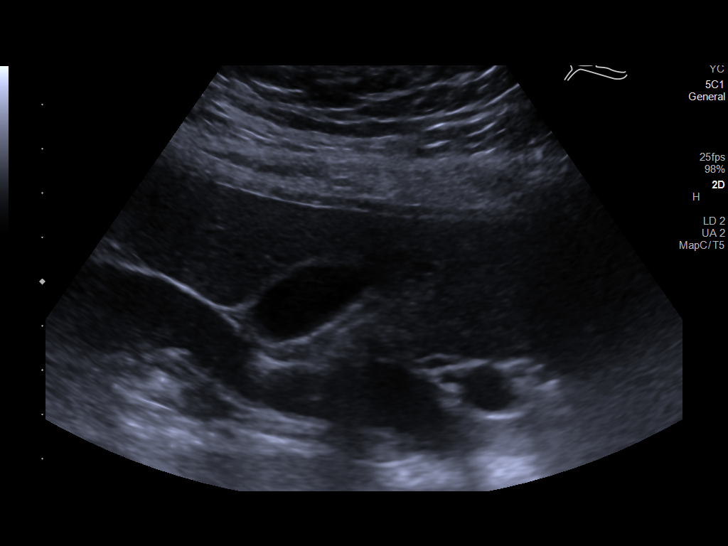
[im 10/40]
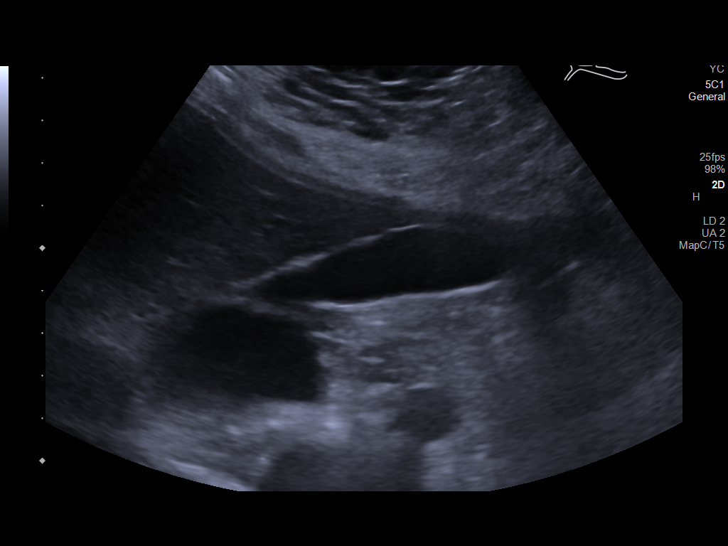
[im 14/40]
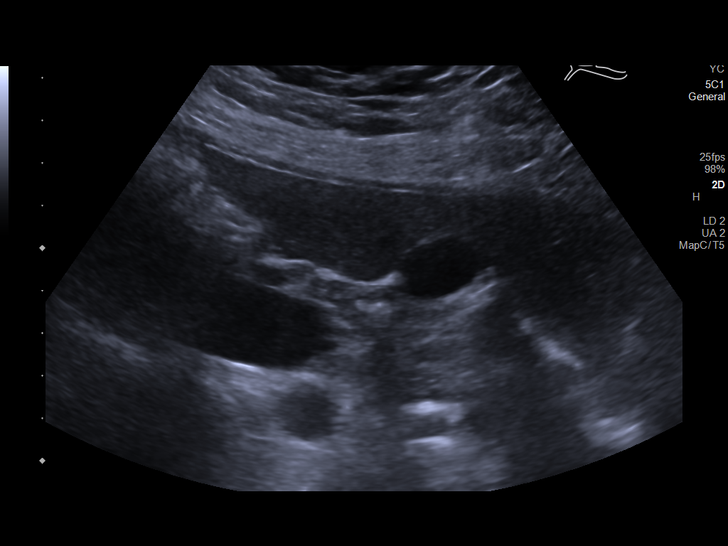
[im 15/40]
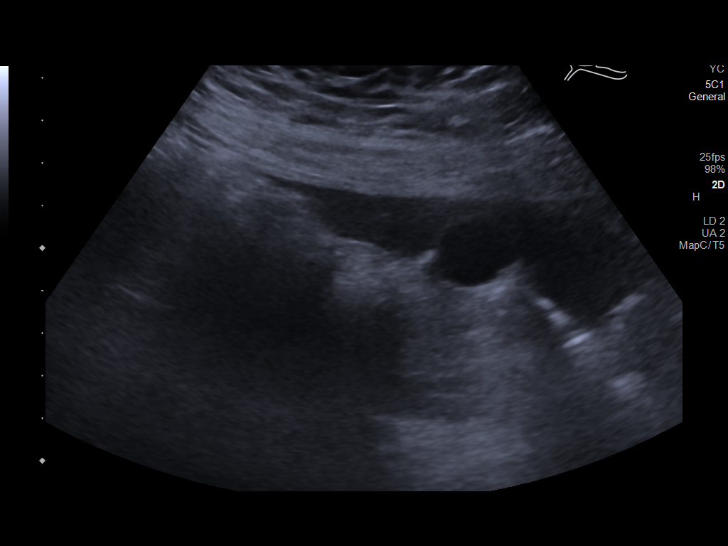
[im 18/40]
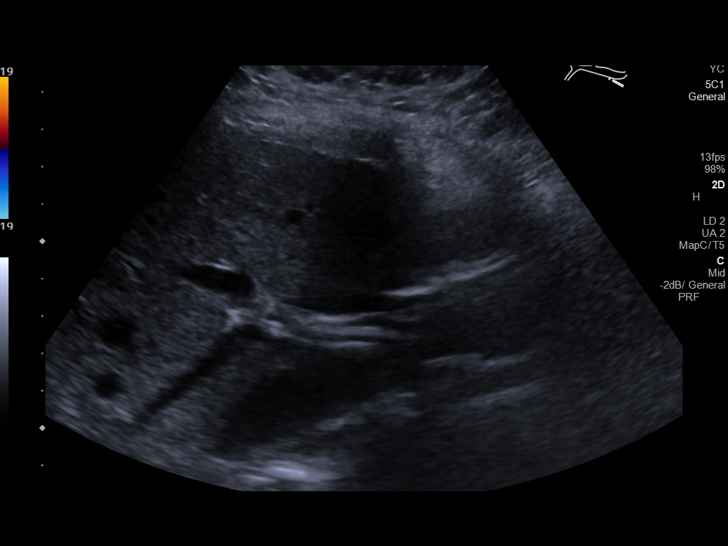
[im 22/40]
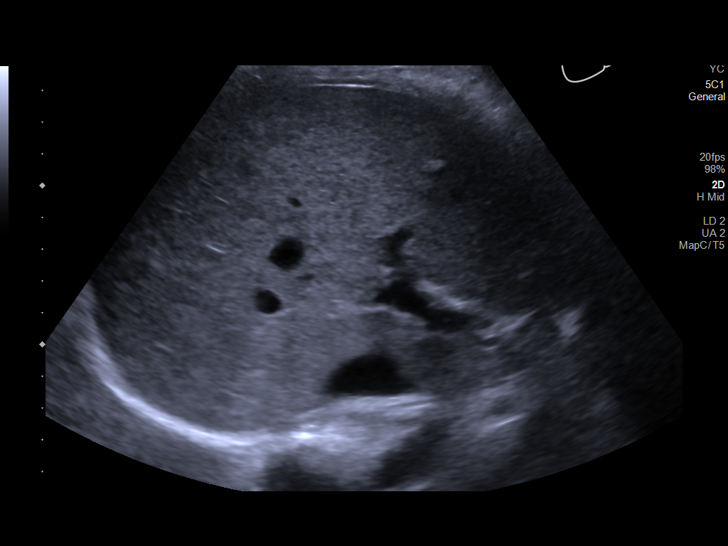
[im 25/40]
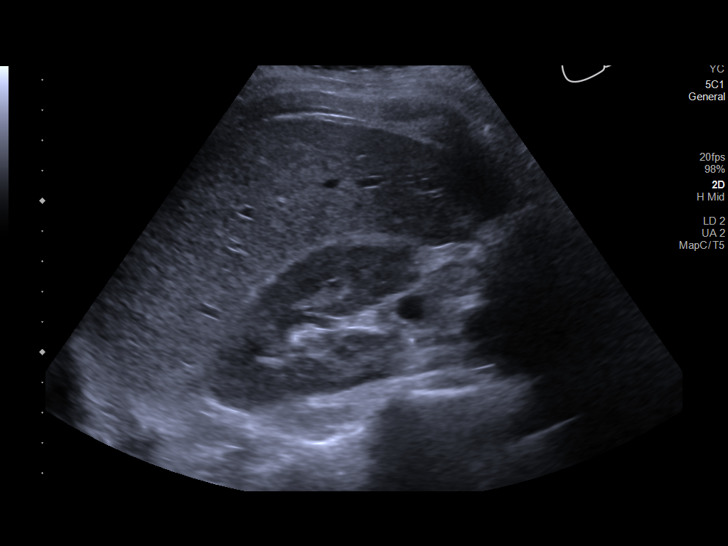
[im 27/40]
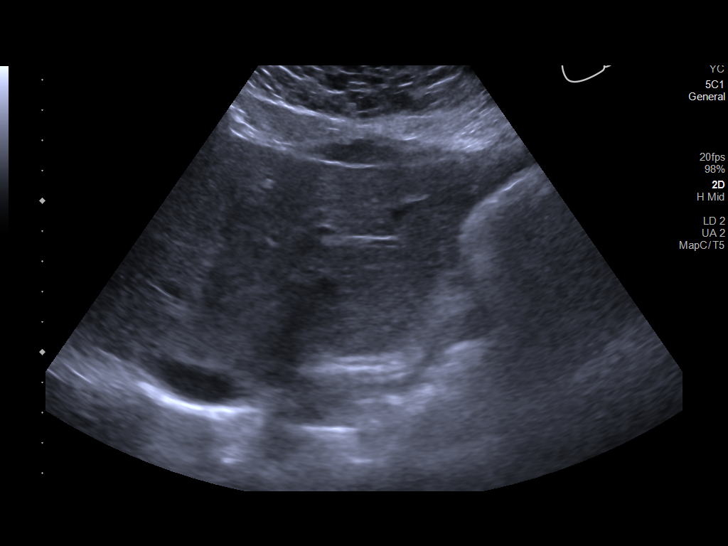
[im 30/40]
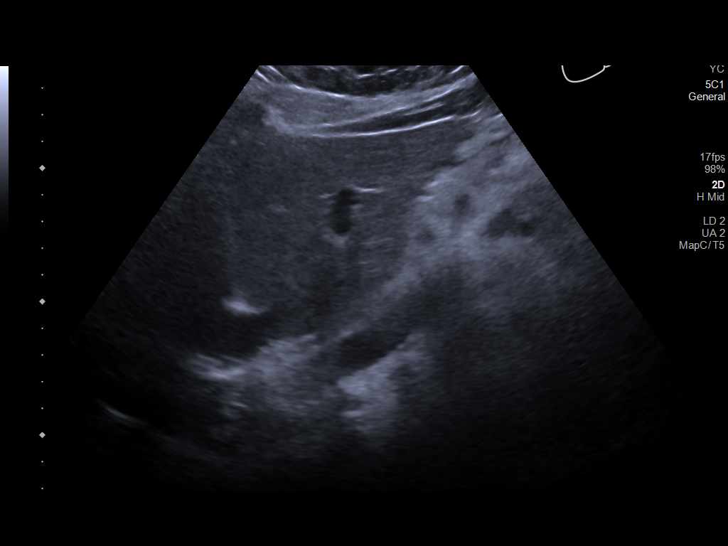
[im 33/40]
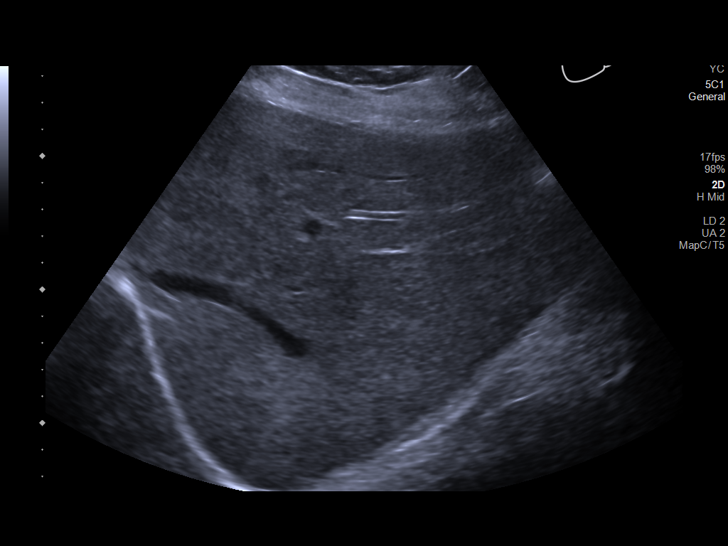
[im 36/40]
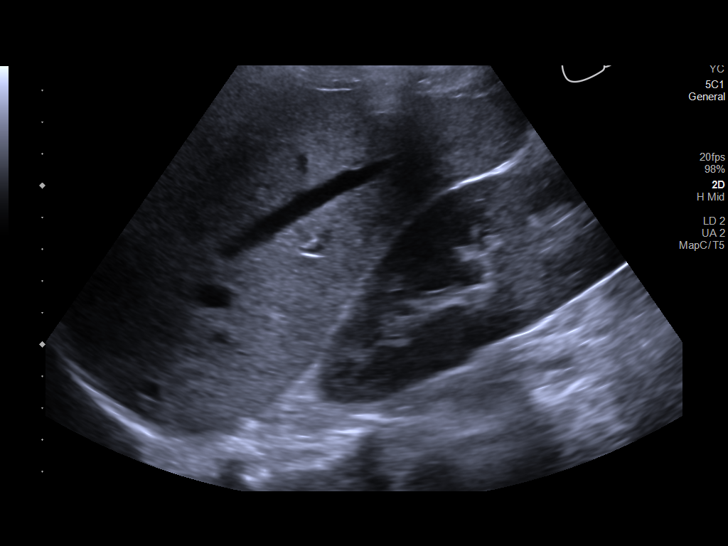
[im 40/40]
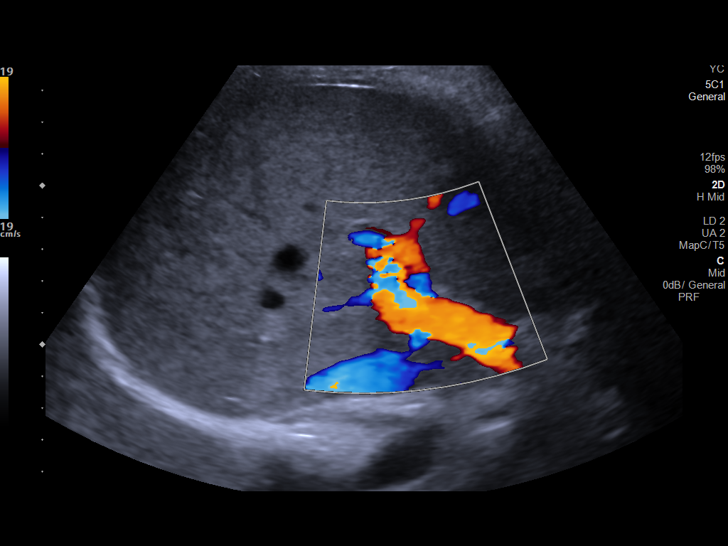

[14 of 25 positions shown; findings below may reference images not displayed]

FINDINGS: Gallbladder:

No gallstones or wall thickening visualized. No sonographic Murphy
sign noted by sonographer.

Common bile duct:

Diameter: 3.8 mm

Liver:

Increased echogenicity in the liver with no focal mass. Portal vein
is patent on color Doppler imaging with normal direction of blood
flow towards the liver.

Other: None.
IMPRESSION: 1. No acute abnormality identified
2. Increased echogenicity diffusely throughout the liver is
nonspecific but often seen with hepatic steatosis.

## 2020-10-29 IMAGING — US US OB COMP LESS 14 WK
1 series · 14 of 28 positions shown · non-contrast
Comparison: None.

CLINICAL DATA: 23-year-old pregnant female with pelvic pain.

EXAM:
OBSTETRIC <14 WK ULTRASOUND
TECHNIQUE: Transabdominal ultrasound was performed for evaluation of the
gestation as well as the maternal uterus and adnexal regions.

[Series 1: us ob comp less 14 wk · 14 of 35 slices shown]
[im 2/35]
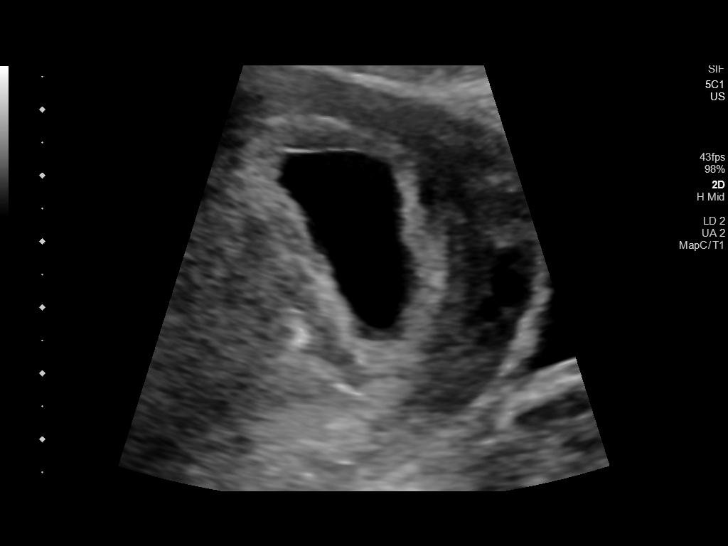
[im 4/35]
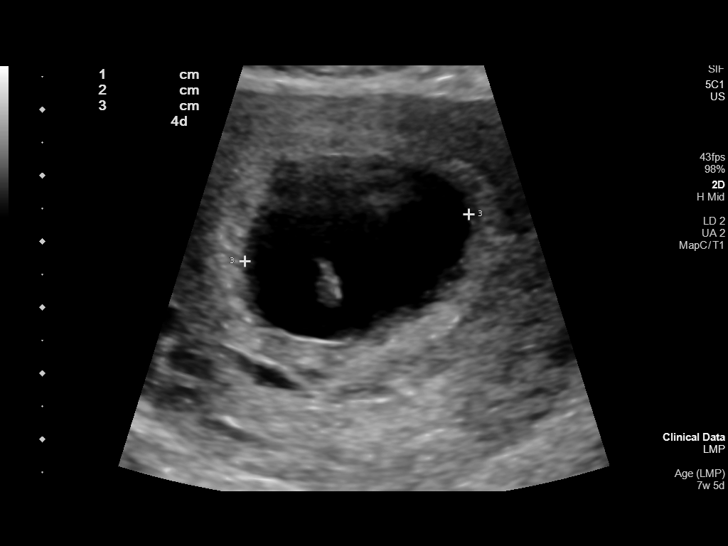
[im 7/35]
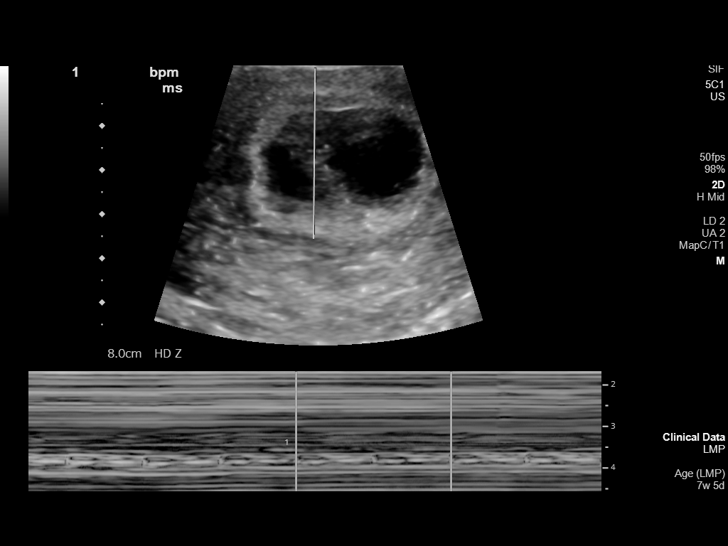
[im 9/35]
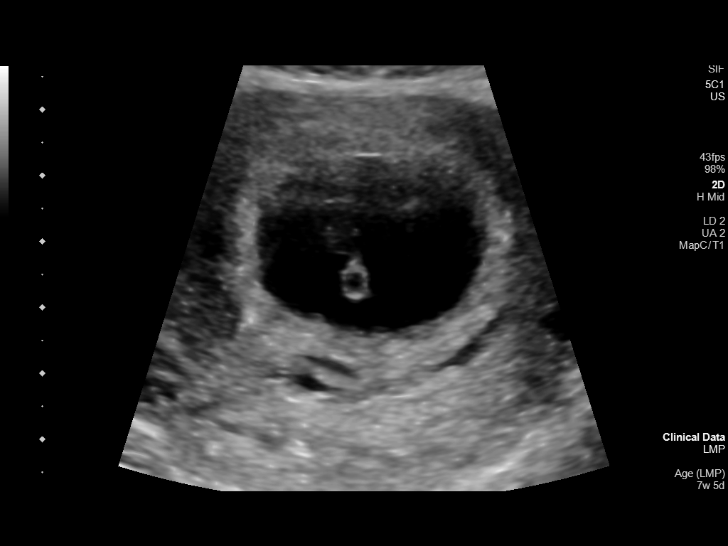
[im 12/35]
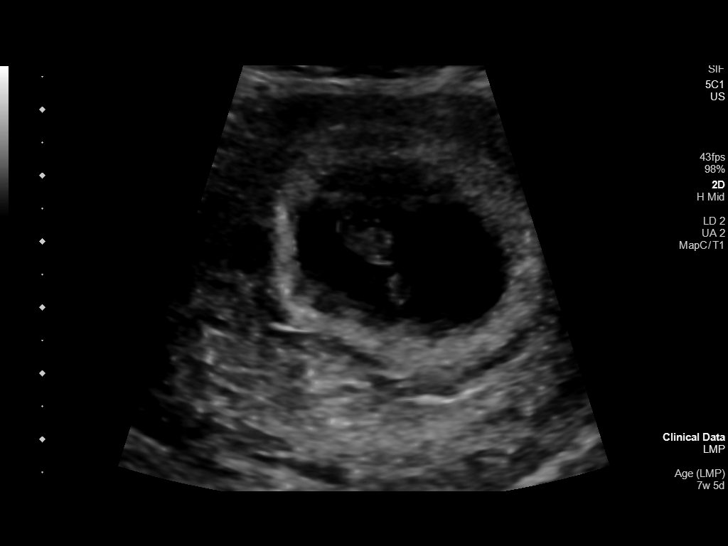
[im 14/35]
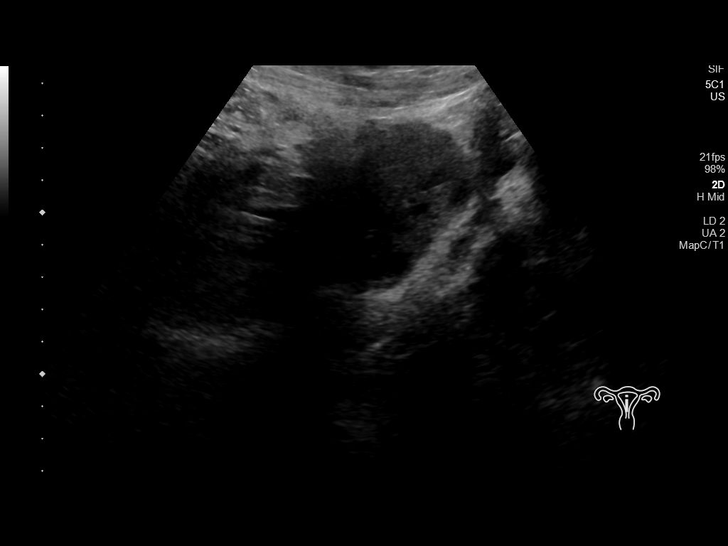
[im 17/35]
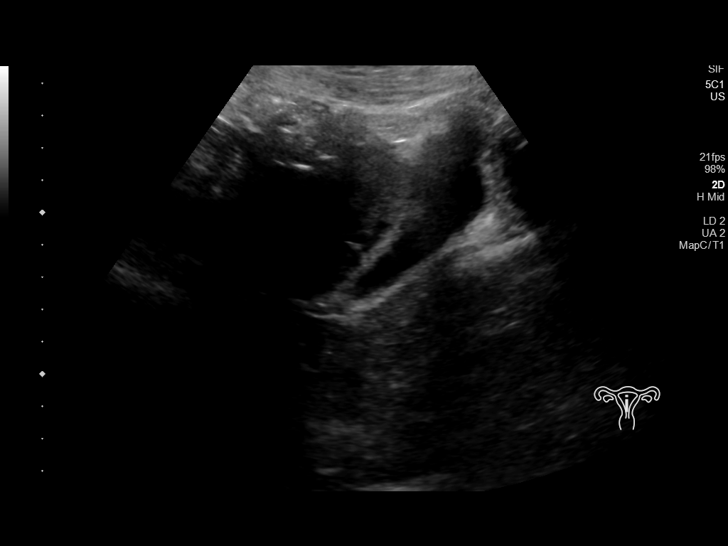
[im 19/35]
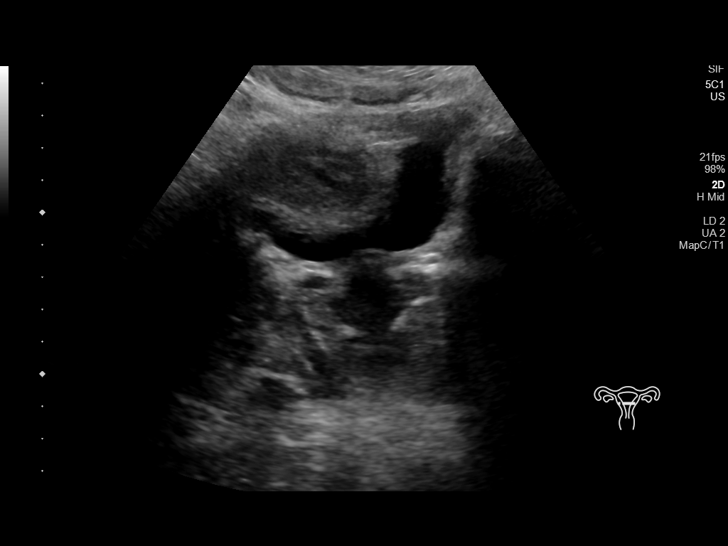
[im 22/35]
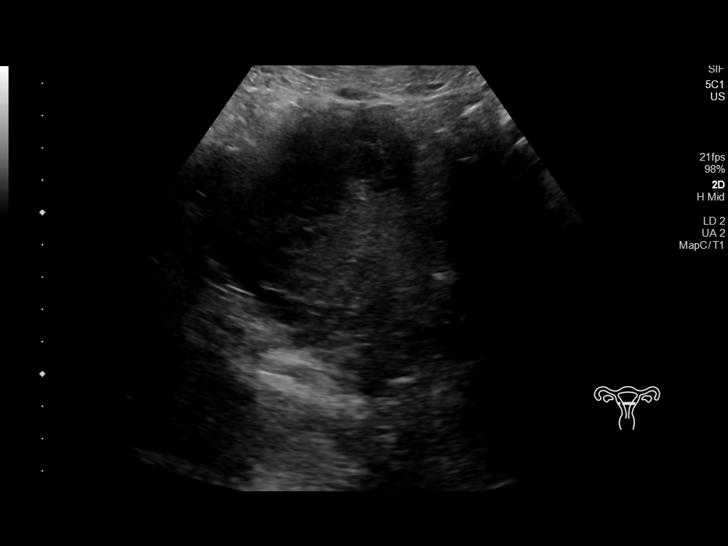
[im 24/35]
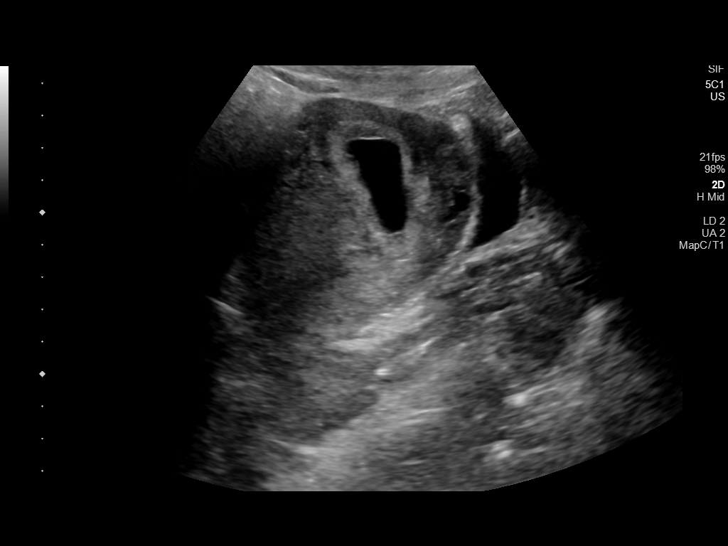
[im 27/35]
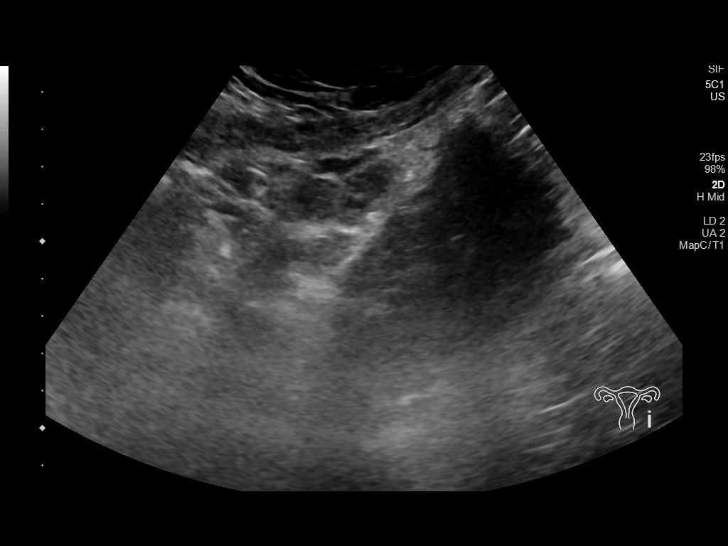
[im 29/35]
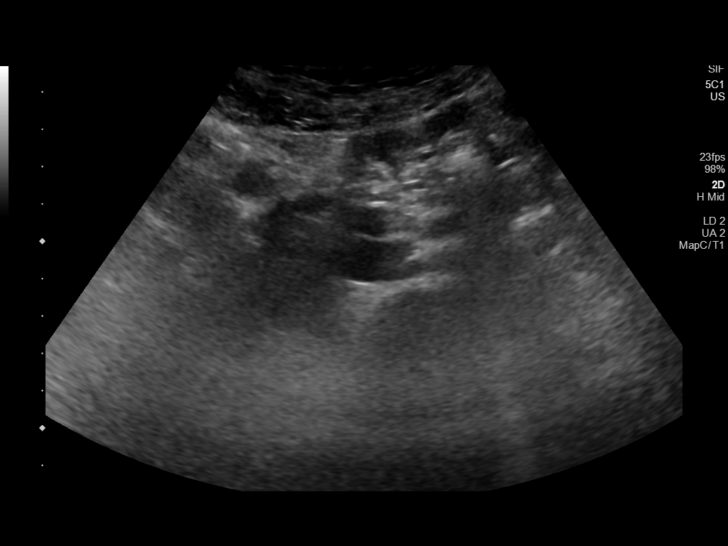
[im 32/35]
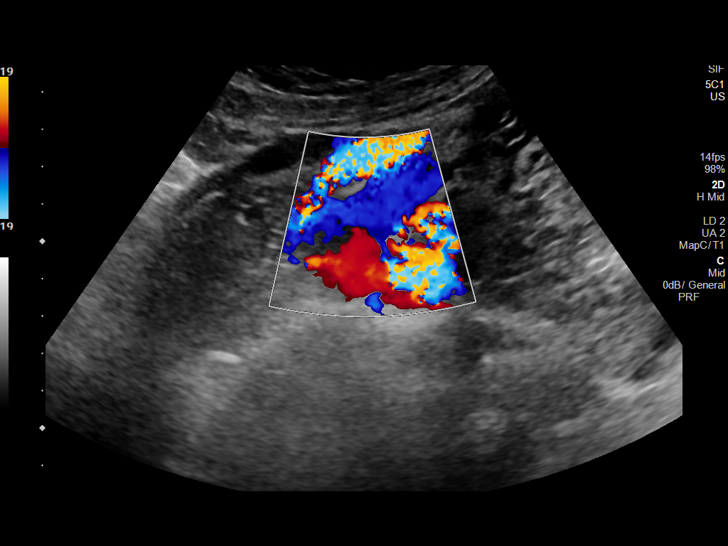
[im 35/35]
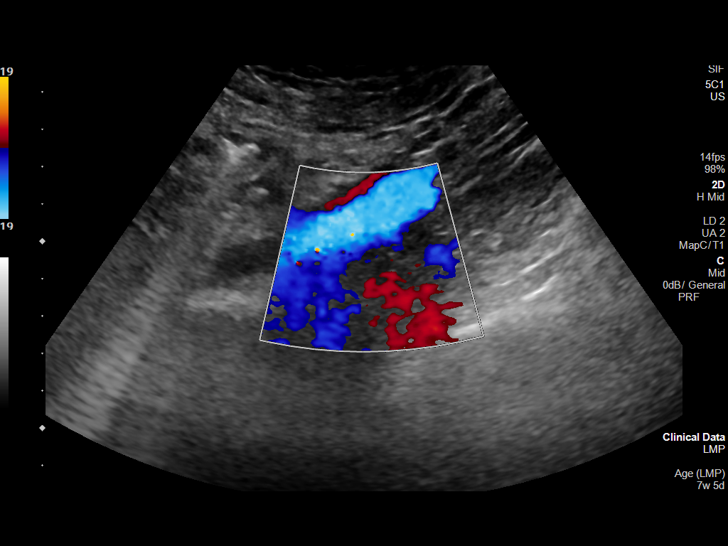

[14 of 28 positions shown; findings below may reference images not displayed]

FINDINGS: Intrauterine gestational sac: Single

Yolk sac:  Visualized.

Embryo:  Visualized.

Cardiac Activity: Visualized.

Heart Rate: 139 bpm

CRL:   10.7 mm   7 w 1 d                  US EDC: 12/06/2019

Subchorionic hemorrhage:  None visualized.

Maternal uterus/adnexae: Ovaries not visualized. No free fluid or
adnexal mass.
IMPRESSION: Single living intrauterine gestation with estimated gestational age
of 7 weeks 1 day by this ultrasound. No subchorionic hemorrhage.

## 2020-12-17 IMAGING — US US OB LIMITED
1 series · 14 of 28 positions shown · non-contrast
Comparison: none

CLINICAL DATA: Pelvic pain for 4 days, history of prior
miscarriage.

EXAM:
LIMITED OBSTETRIC ULTRASOUND

[Series 1: us ob limited · 29 acquisitions, 14 frames shown]
[im 2/29]
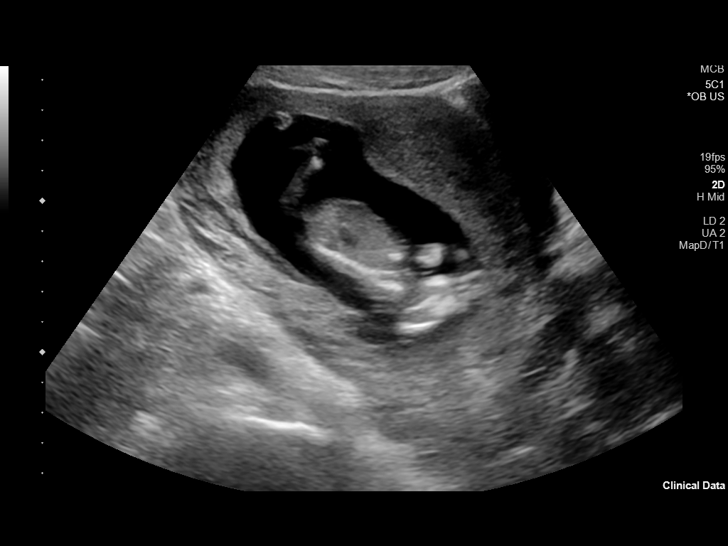
[im 4/29]
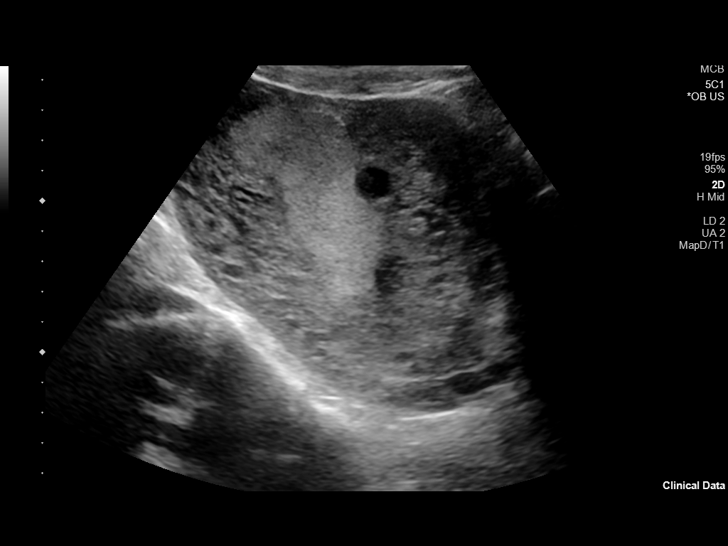
[im 6/29]
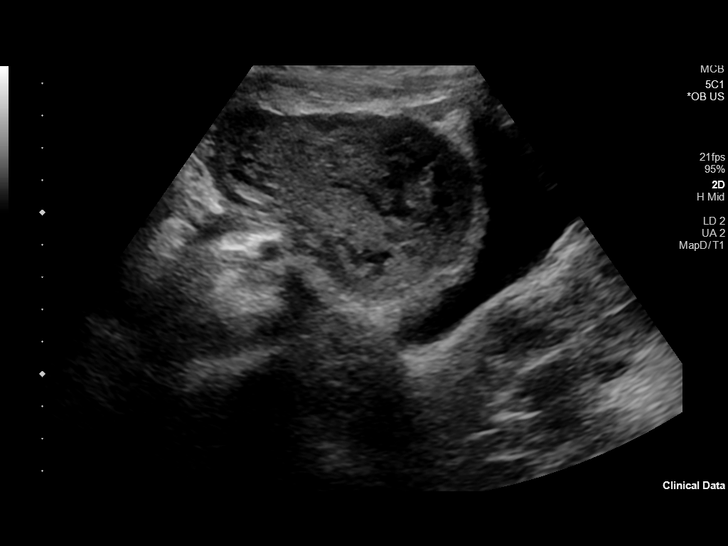
[im 8/29]
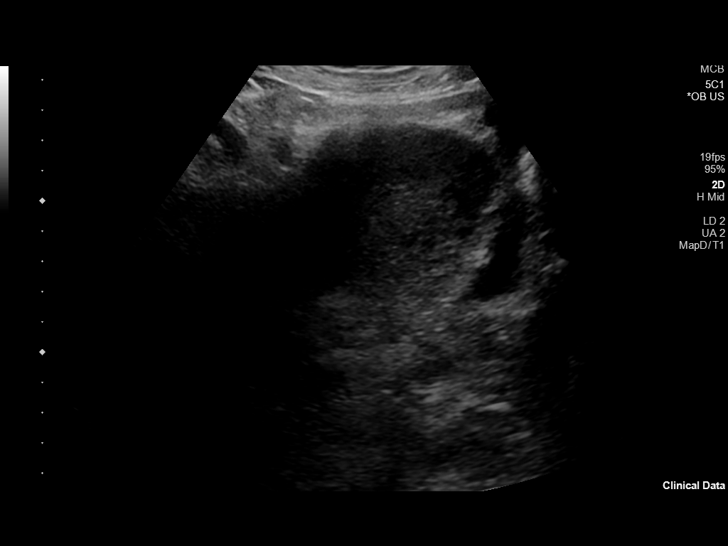
[im 10/29]
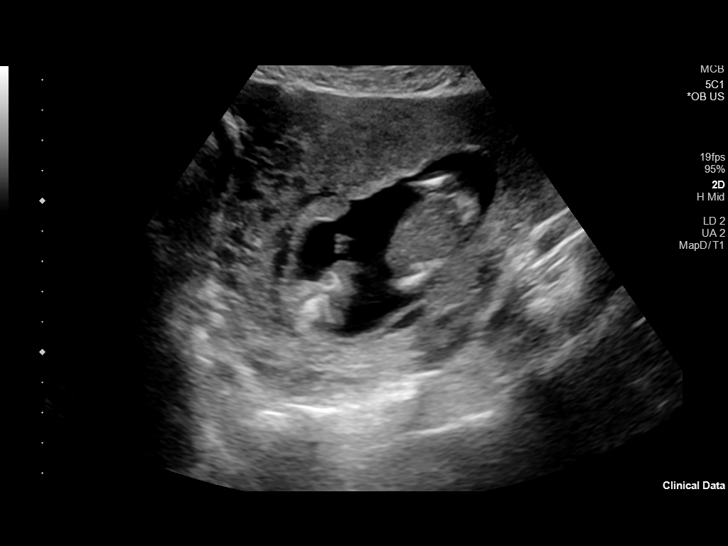
[im 12/29]
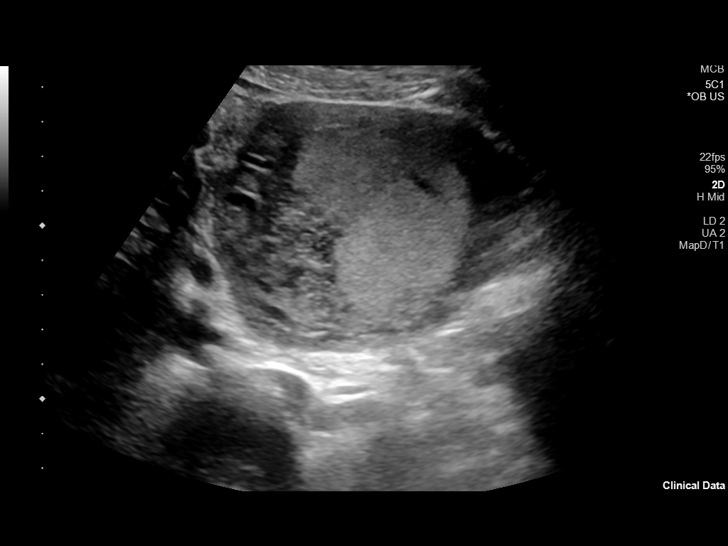
[im 14/29]
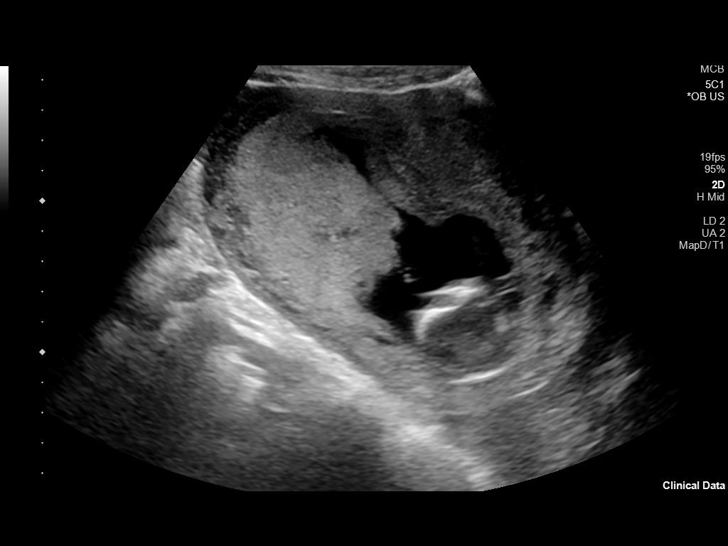
[im 16/29]
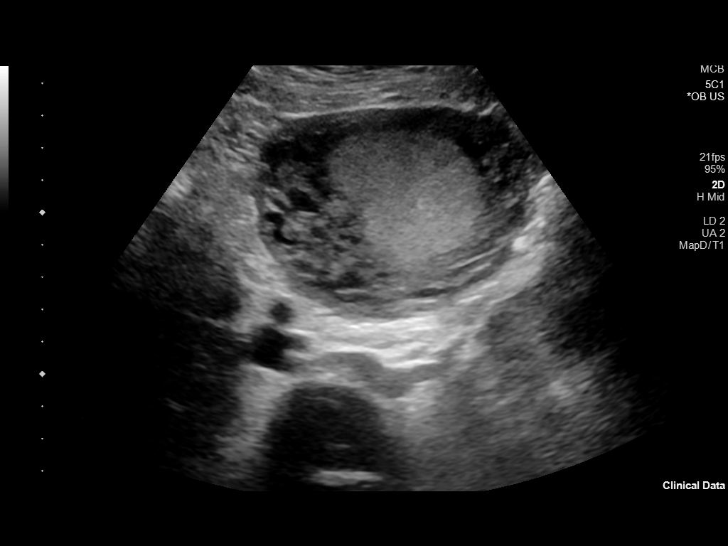
[im 18/29]
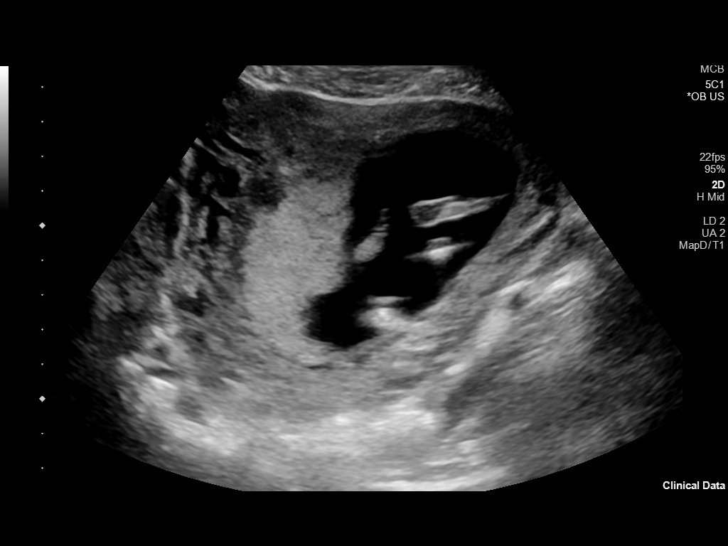
[im 20/29]
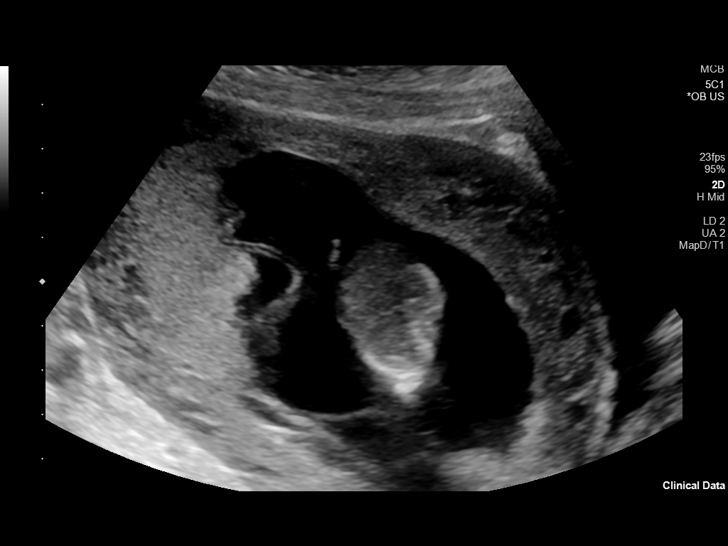
[im 22/29]
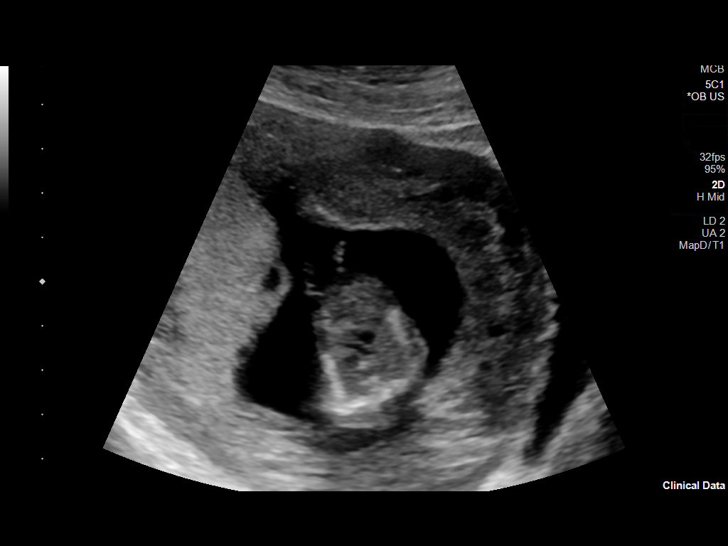
[im 24/29]
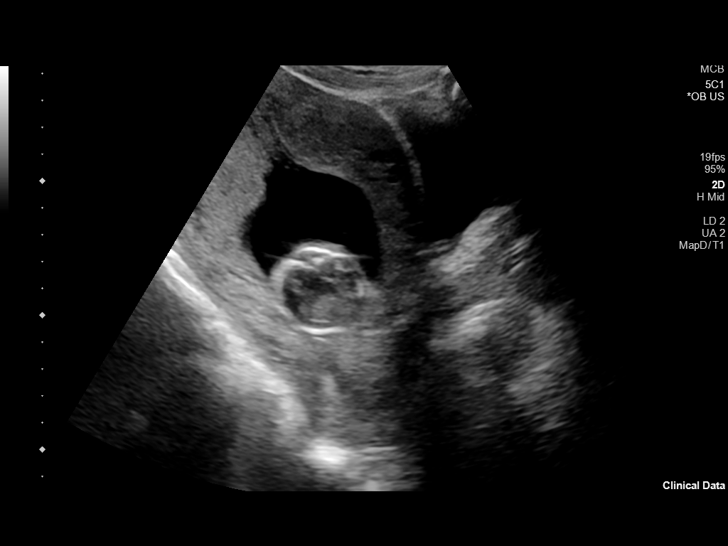
[im 26/29]
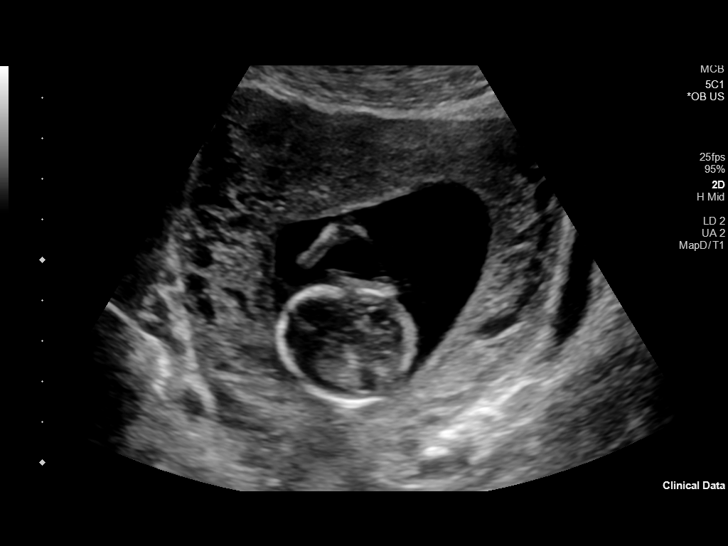
[im 29/29]
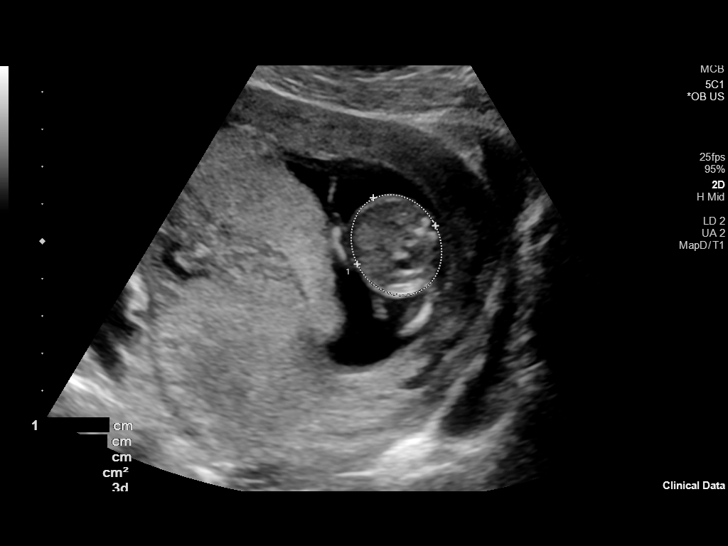

[14 of 28 positions shown; findings below may reference images not displayed]

FINDINGS: Number of Fetuses: 1

Heart Rate:  146 bpm

Movement: Yes

Presentation: Cephalic

Placental Location: Fundal and biased as described above. To the
right.

Previa: None

Amniotic Fluid (Subjective):  Within normal limits.

Femur length 1.5 cm: Estimated 14 weeks 3 days. Biparietal diameter
not obtainable.

MATERNAL FINDINGS:

Cervix:  Appears closed.

Uterus/Adnexae: No abnormality visualized.
IMPRESSION: 1. Single live intrauterine pregnancy in cephalic position as
described above.
2. Cervix is closed.

This exam is performed on an emergent basis and does not
comprehensively evaluate fetal size, dating, or anatomy; follow-up
complete OB US should be considered if further fetal assessment is
warranted.
